# Patient Record
Sex: Female | Born: 1985 | Race: Black or African American | Hispanic: No | Marital: Single | Smoking: Never smoker
Health system: Southern US, Community
[De-identification: ages and names within clinical notes are randomized; demographics above are authoritative.]

## PROBLEM LIST (undated history)

## (undated) DIAGNOSIS — O139 Gestational [pregnancy-induced] hypertension without significant proteinuria, unspecified trimester: Secondary | ICD-10-CM

## (undated) HISTORY — DX: Gestational (pregnancy-induced) hypertension without significant proteinuria, unspecified trimester: O13.9

---

## 2013-03-25 ENCOUNTER — Other Ambulatory Visit: Payer: Self-pay | Admitting: Obstetrics & Gynecology

## 2013-03-25 ENCOUNTER — Other Ambulatory Visit (INDEPENDENT_AMBULATORY_CARE_PROVIDER_SITE_OTHER): Payer: Self-pay

## 2013-03-25 DIAGNOSIS — Z3201 Encounter for pregnancy test, result positive: Secondary | ICD-10-CM

## 2013-03-25 LAB — HIV ANTIBODY (ROUTINE TESTING W REFLEX): HIV: NONREACTIVE

## 2013-03-25 LAB — POCT PREGNANCY, URINE: Preg Test, Ur: POSITIVE — AB

## 2013-03-26 LAB — OBSTETRIC PANEL
Basophils Relative: 0 % (ref 0–1)
Eosinophils Absolute: 0 10*3/uL (ref 0.0–0.7)
Eosinophils Relative: 0 % (ref 0–5)
Hemoglobin: 10.4 g/dL — ABNORMAL LOW (ref 12.0–15.0)
Hepatitis B Surface Ag: NEGATIVE
Lymphs Abs: 2.4 10*3/uL (ref 0.7–4.0)
MCH: 29.7 pg (ref 26.0–34.0)
MCHC: 33.4 g/dL (ref 30.0–36.0)
MCV: 88.9 fL (ref 78.0–100.0)
Monocytes Relative: 6 % (ref 3–12)
Neutrophils Relative %: 63 % (ref 43–77)
Platelets: 322 10*3/uL (ref 150–400)
Rh Type: POSITIVE

## 2013-03-27 ENCOUNTER — Ambulatory Visit (HOSPITAL_COMMUNITY)
Admission: RE | Admit: 2013-03-27 | Discharge: 2013-03-27 | Disposition: A | Discharge status: Home / Self Care | Payer: Medicaid Other | Source: Ambulatory Visit | Attending: Obstetrics & Gynecology | Admitting: Obstetrics & Gynecology

## 2013-03-27 DIAGNOSIS — Z3689 Encounter for other specified antenatal screening: Secondary | ICD-10-CM | POA: Insufficient documentation

## 2013-03-27 DIAGNOSIS — Z3201 Encounter for pregnancy test, result positive: Secondary | ICD-10-CM

## 2013-03-27 DIAGNOSIS — O3680X0 Pregnancy with inconclusive fetal viability, not applicable or unspecified: Secondary | ICD-10-CM | POA: Insufficient documentation

## 2013-03-27 LAB — HEMOGLOBINOPATHY EVALUATION
Hemoglobin Other: 0 %
Hgb A2 Quant: 2.9 % (ref 2.2–3.2)
Hgb A: 97.1 % (ref 96.8–97.8)

## 2013-04-30 ENCOUNTER — Encounter: Payer: Medicaid Other | Admitting: Advanced Practice Midwife

## 2013-05-05 ENCOUNTER — Encounter: Payer: Medicaid Other | Admitting: Obstetrics & Gynecology

## 2013-06-30 ENCOUNTER — Other Ambulatory Visit (HOSPITAL_COMMUNITY)
Admission: RE | Admit: 2013-06-30 | Discharge: 2013-06-30 | Disposition: A | Discharge status: Home / Self Care | Payer: Medicaid Other | Source: Ambulatory Visit | Attending: Family Medicine | Admitting: Family Medicine

## 2013-06-30 ENCOUNTER — Encounter: Payer: Self-pay | Admitting: Family Medicine

## 2013-06-30 ENCOUNTER — Ambulatory Visit (INDEPENDENT_AMBULATORY_CARE_PROVIDER_SITE_OTHER): Payer: Medicaid Other | Admitting: Family Medicine

## 2013-06-30 VITALS — BP 133/86 | Temp 97.0°F | Ht 68.0 in | Wt 223.0 lb

## 2013-06-30 DIAGNOSIS — O10019 Pre-existing essential hypertension complicating pregnancy, unspecified trimester: Secondary | ICD-10-CM | POA: Insufficient documentation

## 2013-06-30 DIAGNOSIS — O0932 Supervision of pregnancy with insufficient antenatal care, second trimester: Secondary | ICD-10-CM | POA: Insufficient documentation

## 2013-06-30 DIAGNOSIS — I10 Essential (primary) hypertension: Secondary | ICD-10-CM

## 2013-06-30 DIAGNOSIS — O10012 Pre-existing essential hypertension complicating pregnancy, second trimester: Secondary | ICD-10-CM

## 2013-06-30 DIAGNOSIS — O0930 Supervision of pregnancy with insufficient antenatal care, unspecified trimester: Secondary | ICD-10-CM

## 2013-06-30 DIAGNOSIS — Z01419 Encounter for gynecological examination (general) (routine) without abnormal findings: Secondary | ICD-10-CM | POA: Insufficient documentation

## 2013-06-30 DIAGNOSIS — O34219 Maternal care for unspecified type scar from previous cesarean delivery: Secondary | ICD-10-CM | POA: Insufficient documentation

## 2013-06-30 DIAGNOSIS — Z113 Encounter for screening for infections with a predominantly sexual mode of transmission: Secondary | ICD-10-CM | POA: Insufficient documentation

## 2013-06-30 LAB — POCT URINALYSIS DIP (DEVICE)
Protein, ur: NEGATIVE mg/dL
Specific Gravity, Urine: 1.02 (ref 1.005–1.030)
Urobilinogen, UA: 0.2 mg/dL (ref 0.0–1.0)

## 2013-06-30 MED ORDER — PRENATAL VITAMINS 0.8 MG PO TABS: 1.0000 | ORAL_TABLET | Freq: Every day | ORAL | Status: DC

## 2013-06-30 NOTE — Progress Notes (Signed)
Pulse   Edema trace in feet.  

## 2013-06-30 NOTE — Progress Notes (Signed)
Nutrition note: 1st visit consult Pt has h/o obesity. Pt has gained 23# @ 25w, which is > expected. Pt reports eating 3 meals & 2-3 snacks/d. Pt reports no N/V but occ heartburn. NKFA. Pt is taking PNV. Pt received verbal & written education on general nutrition during pregnancy.  Discussed wt gain goals of 11-20# or 0.5#/wk. Discussed tips to slow wt gain down. Pt agrees to continue taking PNV & watch her portion sizes. Pt has WIC & plans to BF. F/u if referred Blondell Reveal, MS, RD, LDN

## 2013-06-30 NOTE — Progress Notes (Signed)
Ultrasound scheduled for 07/08/13 at 9:30 am.

## 2013-06-30 NOTE — Progress Notes (Signed)
   Subjective:    Cindy Hendrix is a [id number] [redacted]w[redacted]d being seen today for her first obstetrical visit.  Her obstetrical history is significant for pregnancy induced hypertension and late prenatal care. Not clear that her HTN ever went away post pregnancy.  She does not see an MD regularly.  Pregnancy history fully reviewed.  Patient reports no complaints.  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 Pregnancy induced hypertension    Past Surgical History  Procedure Laterality Date  . Cesarean section     Family History  Problem Relation Age of Onset  . Cancer Maternal Grandfather 37    Colon     Exam    Uterus:   Fundal height is 25  Pelvic Exam:    Perineum: Normal Perineum   Vulva: Bartholin'Hendrix, Urethra, Skene'Hendrix normal   Vagina:  normal mucosa, normal discharge   Cervix: no cervical motion tenderness and no lesions   Adnexa: normal adnexa   Bony Pelvis: average  System: Breast:  normal appearance, no masses or tenderness   Skin: normal coloration and turgor, no rashes    Neurologic: oriented   Extremities: normal strength, tone, and muscle mass   HEENT sclera clear, anicteric   Mouth/Teeth mucous membranes moist, pharynx normal without lesions   Neck supple   Cardiovascular: regular rate and rhythm   Respiratory:  appears well, vitals normal, no respiratory distress, acyanotic, normal RR, ear and throat exam is normal, neck free of mass or lymphadenopathy, chest clear, no wheezing, crepitations, rhonchi, normal symmetric air entry   Abdomen: soft,  non-tender; bowel sounds normal; no masses,  no organomegaly      Assessment:    Pregnancy: [id number] Patient Active Problem List   Diagnosis Date Noted  . Benign essential hypertension antepartum 06/30/2013  . Previous cesarean delivery, antepartum condition or complication 06/30/2013  . Late prenatal care complicating pregnancy in second trimester 06/30/2013        Plan:     Initial labs drawn. Prenatal vitamins. Problem list reviewed and updated. Genetic Screening discussed Quad Screen: too late.  Ultrasound discussed; fetal survey: ordered.  Follow up in 2 weeks. Pap smear today 2 wks return w/ 24 hour urine and for 28 wk labs.     Cindy Hendrix 06/30/2013

## 2013-06-30 NOTE — Patient Instructions (Signed)
Preeclampsia and Eclampsia Preeclampsia is a condition of high blood pressure during pregnancy. It can happen at 20 weeks or later in pregnancy. If high blood pressure occurs in the second half of pregnancy with no other symptoms, it is called gestational hypertension and goes away after the baby is born. If any of the symptoms listed below develop with gestational hypertension, it is then called preeclampsia. Eclampsia (convulsions) may follow preeclampsia. This is one of the reasons for regular prenatal checkups. Early diagnosis and treatment are very important to prevent eclampsia. CAUSES  There is no known cause of preeclampsia/eclampsia in pregnancy. There are several known conditions that may put the pregnant woman at risk, such as:  The first pregnancy.  Having preeclampsia in a past pregnancy.  Having lasting (chronic) high blood pressure.  Having multiples (twins, triplets).  Being age 24 or older.  African American ethnic background.  Having kidney disease or diabetes.  Medical conditions such as lupus or blood diseases.  Being overweight (obese). SYMPTOMS   High blood pressure.  Headaches.  Sudden weight gain.  Swelling of hands, face, legs, and feet.  Protein in the urine.  Feeling sick to your stomach (nauseous) and throwing up (vomiting).  Vision problems (blurred or double vision).  Numbness in the face, arms, legs, and feet.  Dizziness.  Slurred speech.  Preeclampsia can cause growth retardation in the fetus.  Separation (abruption) of the placenta.  Not enough fluid in the amniotic sac (oligohydramnios).  Sensitivity to bright lights.  Belly (abdominal) pain. DIAGNOSIS  If protein is found in the urine in the second half of pregnancy, this is considered preeclampsia. Other symptoms mentioned above may also be present. TREATMENT  It is necessary to treat this.  Your caregiver may prescribe bed rest early in this condition. Plenty of rest and  salt restriction may be all that is needed.  Medicines may be necessary to lower blood pressure if the condition does not respond to more conservative measures.  In more severe cases, hospitalization may be needed:  For treatment of blood pressure.  To control fluid retention.  To monitor the baby to see if the condition is causing harm to the baby.  Hospitalization is the best way to treat the first sign of preeclampsia. This is so the mother and baby can be watched closely and blood tests can be done effectively and correctly.  If the condition becomes severe, it may be necessary to induce labor or to remove the infant by surgical means (cesarean section). The best cure for preeclampsia/eclampsia is to deliver the baby. Preeclampsia and eclampsia involve risks to mother and infant. Your caregiver will discuss these risks with you. Together, you can work out the best possible approach to your problems. Make sure you keep your prenatal visits as scheduled. Not keeping appointments could result in a chronic or permanent injury, pain, disability to you, and death or injury to you or your unborn baby. If there is any problem keeping the appointment, you must call to reschedule. HOME CARE INSTRUCTIONS   Keep your prenatal appointments and tests as scheduled.  Tell your caregiver if you have any of the above risk factors.  Get plenty of rest and sleep.  Eat a balanced diet that is low in salt, and do not add salt to your food.  Avoid stressful situations.  Only take over-the-counter and prescriptions medicines for pain, discomfort, or fever as directed by your caregiver. SEEK IMMEDIATE MEDICAL CARE IF:   You develop severe swelling  anywhere in the body. This usually occurs in the legs.  You gain 5 lb/2.3 kg or more in a week.  You develop a severe headache, dizziness, problems with your vision, or confusion.  You have abdominal pain, nausea, or vomiting.  You have a seizure.  You  have trouble moving any part of your body, or you develop numbness or problems speaking.  You have bruising or abnormal bleeding from anywhere in the body.  You develop a stiff neck.  You pass out. MAKE SURE YOU:   Understand these instructions.  Will watch your condition.  Will get help right away if you are not doing well or get worse. Document Released: 12/01/2000 Document Revised: 02/26/2012 Document Reviewed: 07/17/2008 [facility] Patient Information 2014 [place], [place].  Pregnancy - Second Trimester The second trimester of pregnancy (3 to 6 months) is a period of rapid growth for you and your baby. At the end of the sixth month, your baby is about 9 inches long and weighs 1 1/2 pounds. You will begin to feel the baby move between 18 and 20 weeks of the pregnancy. This is called quickening. Weight gain is faster. A clear fluid (colostrum) may leak out of your breasts. You may feel small contractions of the womb (uterus). This is known as false labor or Braxton-Hicks contractions. This is like a practice for labor when the baby is ready to be born. Usually, the problems with morning sickness have usually passed by the end of your first trimester. Some women develop small dark blotches (called cholasma, mask of pregnancy) on their face that usually goes away after the baby is born. Exposure to the sun makes the blotches worse. Acne may also develop in some pregnant women and pregnant women who have acne, may find that it goes away. PRENATAL EXAMS  Blood work may continue to be done during prenatal exams. These tests are done to check on your health and the probable health of your baby. Blood work is used to follow your blood levels (hemoglobin). Anemia (low hemoglobin) is common during pregnancy. Iron and vitamins are given to help prevent this. You will also be checked for diabetes between 24 and 28 weeks of the pregnancy. Some of the previous blood tests may be repeated.  The size of the  uterus is measured during each visit. This is to make sure that the baby is continuing to grow properly according to the dates of the pregnancy.  Your blood pressure is checked every prenatal visit. This is to make sure you are not getting toxemia.  Your urine is checked to make sure you do not have an infection, diabetes or protein in the urine.  Your weight is checked often to make sure gains are happening at the suggested rate. This is to ensure that both you and your baby are growing normally.  Sometimes, an ultrasound is performed to confirm the proper growth and development of the baby. This is a test which bounces harmless sound waves off the baby so your caregiver can more accurately determine due dates. Sometimes, a test is done on the amniotic fluid surrounding the baby. This test is called an amniocentesis. The amniotic fluid is obtained by sticking a needle into the belly (abdomen). This is done to check the chromosomes in instances where there is a concern about possible genetic problems with the baby. It is also sometimes done near the end of pregnancy if an early delivery is required. In this case, it is done to help  make sure the baby's lungs are mature enough for the baby to live outside of the womb. CHANGES OCCURING IN THE SECOND TRIMESTER OF PREGNANCY Your body goes through many changes during pregnancy. They vary from person to person. Talk to your caregiver about changes you notice that you are concerned about.  During the second trimester, you will likely have an increase in your appetite. It is normal to have cravings for certain foods. This varies from person to person and pregnancy to pregnancy.  Your lower abdomen will begin to bulge.  You may have to urinate more often because the uterus and baby are pressing on your bladder. It is also common to get more bladder infections during pregnancy. You can help this by drinking lots of fluids and emptying your bladder before and  after intercourse.  You may begin to get stretch marks on your hips, abdomen, and breasts. These are normal changes in the body during pregnancy. There are no exercises or medicines to take that prevent this change.  You may begin to develop swollen and bulging veins (varicose veins) in your legs. Wearing support hose, elevating your feet for 15 minutes, 3 to 4 times a day and limiting salt in your diet helps lessen the problem.  Heartburn may develop as the uterus grows and pushes up against the stomach. Antacids recommended by your caregiver helps with this problem. Also, eating smaller meals 4 to 5 times a day helps.  Constipation can be treated with a stool softener or adding bulk to your diet. Drinking lots of fluids, and eating vegetables, fruits, and whole grains are helpful.  Exercising is also helpful. If you have been very active up until your pregnancy, most of these activities can be continued during your pregnancy. If you have been less active, it is helpful to start an exercise program such as walking.  Hemorrhoids may develop at the end of the second trimester. Warm sitz baths and hemorrhoid cream recommended by your caregiver helps hemorrhoid problems.  Backaches may develop during this time of your pregnancy. Avoid heavy lifting, wear low heal shoes, and practice good posture to help with backache problems.  Some pregnant women develop tingling and numbness of their hand and fingers because of swelling and tightening of ligaments in the wrist (carpel tunnel syndrome). This goes away after the baby is born.  As your breasts enlarge, you may have to get a bigger bra. Get a comfortable, cotton, support bra. Do not get a nursing bra until the last month of the pregnancy if you will be nursing the baby.  You may get a dark line from your belly button to the pubic area called the linea nigra.  You may develop rosy cheeks because of increase blood flow to the face.  You may develop  spider looking lines of the face, neck, arms, and chest. These go away after the baby is born. HOME CARE INSTRUCTIONS   It is extremely important to avoid all smoking, herbs, alcohol, and unprescribed drugs during your pregnancy. These chemicals affect the formation and growth of the baby. Avoid these chemicals throughout the pregnancy to ensure the delivery of a healthy infant.  Most of your home care instructions are the same as suggested for the first trimester of your pregnancy. Keep your caregiver's appointments. Follow your caregiver's instructions regarding medicine use, exercise, and diet.  During pregnancy, you are providing food for you and your baby. Continue to eat regular, well-balanced meals. Choose foods such as meat, fish,  milk and other low fat dairy products, vegetables, fruits, and whole-grain breads and cereals. Your caregiver will tell you of the ideal weight gain.  A physical sexual relationship may be continued up until near the end of pregnancy if there are no other problems. Problems could include early (premature) leaking of amniotic fluid from the membranes, vaginal bleeding, abdominal pain, or other medical or pregnancy problems.  Exercise regularly if there are no restrictions. Check with your caregiver if you are unsure of the safety of some of your exercises. The greatest weight gain will occur in the last 2 trimesters of pregnancy. Exercise will help you:  Control your weight.  Get you in shape for labor and delivery.  Lose weight after you have the baby.  Wear a good support or jogging bra for breast tenderness during pregnancy. This may help if worn during sleep. Pads or tissues may be used in the bra if you are leaking colostrum.  Do not use hot tubs, steam rooms or saunas throughout the pregnancy.  Wear your seat belt at all times when driving. This protects you and your baby if you are in an accident.  Avoid raw meat, uncooked cheese, cat litter boxes,  and soil used by cats. These carry germs that can cause birth defects in the baby.  The second trimester is also a good time to visit your dentist for your dental health if this has not been done yet. Getting your teeth cleaned is okay. Use a soft toothbrush. Brush gently during pregnancy.  It is easier to leak urine during pregnancy. Tightening up and strengthening the pelvic muscles will help with this problem. Practice stopping your urination while you are going to the bathroom. These are the same muscles you need to strengthen. It is also the muscles you would use as if you were trying to stop from passing gas. You can practice tightening these muscles up 10 times a set and repeating this about 3 times per day. Once you know what muscles to tighten up, do not perform these exercises during urination. It is more likely to contribute to an infection by backing up the urine.  Ask for help if you have financial, counseling, or nutritional needs during pregnancy. Your caregiver will be able to offer counseling for these needs as well as refer you for other special needs.  Your skin may become oily. If so, wash your face with mild soap, use non-greasy moisturizer and oil or cream based makeup. MEDICINES AND DRUG USE IN PREGNANCY  Take prenatal vitamins as directed. The vitamin should contain 1 milligram of folic acid. Keep all vitamins out of reach of children. Only a couple vitamins or tablets containing iron may be fatal to a baby or young child when ingested.  Avoid use of all medicines, including herbs, over-the-counter medicines, not prescribed or suggested by your caregiver. Only take over-the-counter or prescription medicines for pain, discomfort, or fever as directed by your caregiver. Do not use aspirin.  Let your caregiver also know about herbs you may be using.  Alcohol is related to a number of birth defects. This includes fetal alcohol syndrome. All alcohol, in any form, should be avoided  completely. Smoking will cause low birth rate and premature babies.  Street or illegal drugs are very harmful to the baby. They are absolutely forbidden. A baby born to an addicted mother will be addicted at birth. The baby will go through the same withdrawal an adult does. SEEK MEDICAL CARE IF:  You have any concerns or worries during your pregnancy. It is better to call with your questions if you feel they cannot wait, rather than worry about them. SEEK IMMEDIATE MEDICAL CARE IF:   An unexplained oral temperature above 102 F (38.9 C) develops, or as your caregiver suggests.  You have leaking of fluid from the vagina (birth canal). If leaking membranes are suspected, take your temperature and tell your caregiver of this when you call.  There is vaginal spotting, bleeding, or passing clots. Tell your caregiver of the amount and how many pads are used. Light spotting in pregnancy is common, especially following intercourse.  You develop a bad smelling vaginal discharge with a change in the color from clear to white.  You continue to feel sick to your stomach (nauseated) and have no relief from remedies suggested. You vomit blood or coffee ground-like materials.  You lose more than 2 pounds of weight or gain more than 2 pounds of weight over 1 week, or as suggested by your caregiver.  You notice swelling of your face, hands, feet, or legs.  You get exposed to [place] measles and have never had them.  You are exposed to fifth disease or chickenpox.  You develop belly (abdominal) pain. Round ligament discomfort is a common non-cancerous (benign) cause of abdominal pain in pregnancy. Your caregiver still must evaluate you.  You develop a bad headache that does not go away.  You develop fever, diarrhea, pain with urination, or shortness of breath.  You develop visual problems, blurry, or double vision.  You fall or are in a car accident or any kind of trauma.  There is mental or  physical violence at home. Document Released: 11/28/2001 Document Revised: 08/28/2012 Document Reviewed: 06/02/2009 [facility] Patient Information 2014 [place], [place].  Breastfeeding A change in hormones during your pregnancy causes growth of your breast tissue and an increase in number and size of milk ducts. The hormone prolactin allows proteins, sugars, and fats from your blood supply to make breast milk in your milk-producing glands. The hormone progesterone prevents breast milk from being released before the birth of your baby. After the birth of your baby, your progesterone level decreases allowing breast milk to be released. Thoughts of your baby, as well as his or her sucking or crying, can stimulate the release of milk from the milk-producing glands. Deciding to breastfeed (nurse) is one of the best choices you can make for you and your baby. The information that follows gives a brief review of the benefits, as well as other important skills to know about breastfeeding. BENEFITS OF BREASTFEEDING For your baby  The first milk (colostrum) helps your baby's digestive system function better.   There are antibodies in your milk that help your baby fight off infections.   Your baby has a lower incidence of asthma, allergies, and sudden infant death syndrome (SIDS).   The nutrients in breast milk are better for your baby than infant formulas.  Breast milk improves your baby's brain development.   Your baby will have less gas, colic, and constipation.  Your baby is less likely to develop other conditions, such as childhood obesity, asthma, or diabetes mellitus. For you  Breastfeeding helps develop a very special bond between you and your baby.   Breastfeeding is convenient, always available at the correct temperature, and costs nothing.   Breastfeeding helps to burn calories and helps you lose the weight gained during pregnancy.   Breastfeeding makes your uterus contract back  down  to normal size faster and slows bleeding following delivery.   Breastfeeding mothers have a lower risk of developing osteoporosis or breast or ovarian cancer later in life.  BREASTFEEDING FREQUENCY  A healthy, full-term baby may breastfeed as often as every hour or space his or her feedings to every 3 hours. Breastfeeding frequency will vary from baby to baby.   Newborns should be fed no less than every 2 3 hours during the day and every 4 5 hours during the night. You should breastfeed a minimum of 8 feedings in a 24 hour period.  Awaken your baby to breastfeed if it has been 3 4 hours since the last feeding.  Breastfeed when you feel the need to reduce the fullness of your breasts or when your newborn shows signs of hunger. Signs that your baby may be hungry include:  Increased alertness or activity.  Stretching.  Movement of the head from side to side.  Movement of the head and opening of the mouth when the corner of the mouth or cheek is stroked (rooting).  Increased sucking sounds, smacking lips, cooing, sighing, or squeaking.  Hand-to-mouth movements.  Increased sucking of fingers or hands.  Fussing.  Intermittent crying.  Signs of extreme hunger will require calming and consoling before you try to feed your baby. Signs of extreme hunger may include:  Restlessness.  A loud, strong cry.  Screaming.  Frequent feeding will help you make more milk and will help prevent problems, such as sore nipples and engorgement of the breasts.  BREASTFEEDING   Whether lying down or sitting, be sure that the baby's abdomen is facing your abdomen.   Support your breast with 4 fingers under your breast and your thumb above your nipple. Make sure your fingers are well away from your nipple and your baby's mouth.   Stroke your baby's lips gently with your finger or nipple.   When your baby's mouth is open wide enough, place all of your nipple and as much of the colored  area around your nipple (areola) as possible into your baby's mouth.  More areola should be visible above his or her upper lip than below his or her lower lip.  Your baby's tongue should be between his or her lower gum and your breast.  Ensure that your baby's mouth is correctly positioned around the nipple (latched). Your baby's lips should create a seal on your breast.  Signs that your baby has effectively latched onto your nipple include:  Tugging or sucking without pain.  Swallowing heard between sucks.  Absent click or smacking sound.  Muscle movement above and in front of his or her ears with sucking.  Your baby must suck about 2 3 minutes in order to get your milk. Allow your baby to feed on each breast as long as he or she wants. Nurse your baby until he or she unlatches or falls asleep at the first breast, then offer the second breast.  Signs that your baby is full and satisfied include:  A gradual decrease in the number of sucks or complete cessation of sucking.  Falling asleep.  Extension or relaxation of his or her body.  Retention of a small amount of milk in his or her mouth.  Letting go of your breast by himself or herself.  Signs of effective breastfeeding in you include:  Breasts that have increased firmness, weight, and size prior to feeding.  Breasts that are softer after nursing.  Increased milk volume, as well as  a change in milk consistency and color by the 5th day of breastfeeding.  Breast fullness relieved by breastfeeding.  Nipples are not sore, cracked, or bleeding.  If needed, break the suction by putting your finger into the corner of your baby's mouth and sliding your finger between his or her gums. Then, remove your breast from his or her mouth.  It is common for babies to spit up a small amount after a feeding.  Babies often swallow air during feeding. This can make babies fussy. Burping your baby between breasts can help with  this.  Vitamin D supplements are recommended for babies who get only breast milk.  Avoid using a pacifier during your baby's first 4 6 weeks.  Avoid supplemental feedings of water, formula, or juice in place of breastfeeding. Breast milk is all the food your baby needs. It is not necessary for your baby to have water or formula. Your breasts will make more milk if supplemental feedings are avoided during the early weeks. HOW TO TELL WHETHER YOUR BABY IS GETTING ENOUGH BREAST MILK Wondering whether or not your baby is getting enough milk is a common concern among mothers. You can be assured that your baby is getting enough milk if:   Your baby is actively sucking and you hear swallowing.   Your baby seems relaxed and satisfied after a feeding.   Your baby nurses at least 8 12 times in a 24 hour time period.  During the first 51 66 days of age:  Your baby is wetting at least 3 5 diapers in a 24 hour period. The urine should be clear and pale yellow.  Your baby is having at least 3 4 stools in a 24 hour period. The stool should be soft and yellow.  At 63 36 days of age, your baby is having at least 3 6 stools in a 24 hour period. The stool should be seedy and yellow by 5 days of age.  Your baby has a weight loss less than 7 10% during the first 41 days of age.  Your baby does not lose weight after 35 1 days of age.  Your baby gains 4 7 ounces each week after he or she is 5 days of age.  Your baby gains weight by 14 days of age and is back to birth weight within 2 weeks. ENGORGEMENT In the first week after your baby is born, you may experience extremely full breasts (engorgement). When engorged, your breasts may feel heavy, warm, or tender to the touch. Engorgement peaks within 24 48 hours after delivery of your baby.  Engorgement may be reduced by:  Continuing to breastfeed.  Increasing the frequency of breastfeeding.  Taking warm showers or applying warm, moist heat to your breasts  just before each feeding. This increases circulation and helps the milk flow.   Gently massaging your breast before and during the feedings. With your fingertips, massage from your chest wall towards your nipple in a circular motion.   Ensuring that your baby empties at least one breast at every feeding. It also helps to start the next feeding on the opposite breast.   Expressing breast milk by hand or by using a breast pump to empty the breasts if your baby is sleepy, or not nursing well. You may also want to express milk if you are returning to work oryou feel you are getting engorged.  Ensuring your baby is latched on and positioned properly while breastfeeding. If you follow these  suggestions, your engorgement should improve in 24 48 hours. If you are still experiencing difficulty, call your lactation consultant or caregiver.  CARING FOR YOURSELF Take care of your breasts.  Bathe or shower daily.   Avoid using soap on your nipples.   Wear a supportive bra. Avoid wearing underwire style bras.  Air dry your nipples for a 3 after each feeding.   Use only cotton bra pads to absorb breast milk leakage. Leaking of breast milk between feedings is normal.   Use only pure lanolin on your nipples after nursing. You do not need to wash it off before feeding your baby again. Another option is to express a few drops of breast milk and gently massage that milk into your nipples.  Continue breast self-awareness checks. Take care of yourself.  Eat healthy foods. Alternate 3 meals with 3 snacks.  Avoid foods that you notice affect your baby in a bad way.  Drink milk, fruit juice, and water to satisfy your thirst (about 8 glasses a day).   Rest often, relax, and take your prenatal vitamins to prevent fatigue, stress, and anemia.  Avoid chewing and smoking tobacco.  Avoid alcohol and drug use.  Take over-the-counter and prescribed medicine only as directed by your  caregiver or pharmacist. You should always check with your caregiver or pharmacist before taking any new medicine, vitamin, or herbal supplement.  Know that pregnancy is possible while breastfeeding. If desired, talk to your caregiver about family planning and safe birth control methods that may be used while breastfeeding. SEEK MEDICAL CARE IF:   You feel like you want to stop breastfeeding or have become frustrated with breastfeeding.  You have painful breasts or nipples.  Your nipples are cracked or bleeding.  Your breasts are red, tender, or warm.  You have a swollen area on either breast.  You have a fever or chills.  You have nausea or vomiting.  You have drainage from your nipples.  Your breasts do not become full before feedings by the 5th day after delivery.  You feel sad and depressed.  Your baby is too sleepy to eat well.  Your baby is having trouble sleeping.   Your baby is wetting less than 3 diapers in a 24 hour period.  Your baby has less than 3 stools in a 24 hour period.  Your baby's skin or the white part of his or her eyes becomes more yellow.   Your baby is not gaining weight by 49 days of age. MAKE SURE YOU:   Understand these instructions.  Will watch your condition.  Will get help right away if you are not doing well or get worse. Document Released: 12/04/2005 Document Revised: 08/28/2012 Document Reviewed: 07/10/2012 [facility] Patient Information 2014 [place], [place].

## 2013-07-02 LAB — CULTURE, OB URINE

## 2013-07-03 ENCOUNTER — Telehealth: Payer: Self-pay | Admitting: Obstetrics and Gynecology

## 2013-07-03 DIAGNOSIS — N390 Urinary tract infection, site not specified: Secondary | ICD-10-CM

## 2013-07-03 MED ORDER — CEPHALEXIN 500 MG PO CAPS: 500.0000 mg | ORAL_CAPSULE | Freq: Four times a day (QID) | ORAL | Status: DC

## 2013-07-03 NOTE — Telephone Encounter (Signed)
   Needs treatment for UTI-Keflex 500mg  qid x 7 d, # 28, no RF    Called patient to give message from Dr. Shawnie Pons for UTI and Rx sent to pharmacy. pls let patient know.

## 2013-07-08 ENCOUNTER — Ambulatory Visit (HOSPITAL_COMMUNITY)
Admission: RE | Admit: 2013-07-08 | Discharge: 2013-07-08 | Disposition: A | Discharge status: Home / Self Care | Payer: Medicaid Other | Source: Ambulatory Visit | Attending: Family Medicine | Admitting: Family Medicine

## 2013-07-08 ENCOUNTER — Other Ambulatory Visit: Payer: Medicaid Other

## 2013-07-08 DIAGNOSIS — O10012 Pre-existing essential hypertension complicating pregnancy, second trimester: Secondary | ICD-10-CM

## 2013-07-08 DIAGNOSIS — O0932 Supervision of pregnancy with insufficient antenatal care, second trimester: Secondary | ICD-10-CM

## 2013-07-08 DIAGNOSIS — O0930 Supervision of pregnancy with insufficient antenatal care, unspecified trimester: Secondary | ICD-10-CM | POA: Insufficient documentation

## 2013-07-08 DIAGNOSIS — I10 Essential (primary) hypertension: Secondary | ICD-10-CM

## 2013-07-08 DIAGNOSIS — O34219 Maternal care for unspecified type scar from previous cesarean delivery: Secondary | ICD-10-CM

## 2013-07-08 DIAGNOSIS — O10019 Pre-existing essential hypertension complicating pregnancy, unspecified trimester: Secondary | ICD-10-CM | POA: Insufficient documentation

## 2013-07-08 LAB — COMPREHENSIVE METABOLIC PANEL
ALT: 17 U/L (ref 0–35)
AST: 21 U/L (ref 0–37)
Albumin: 3.9 g/dL (ref 3.5–5.2)
Alkaline Phosphatase: 54 U/L (ref 39–117)
Glucose, Bld: 83 mg/dL (ref 70–99)
Potassium: 4.2 mEq/L (ref 3.5–5.3)
Sodium: 138 mEq/L (ref 135–145)
Total Protein: 6.7 g/dL (ref 6.0–8.3)

## 2013-07-08 NOTE — Telephone Encounter (Signed)
Called and left message that there is an Rx for antibiotics at her [facility] pharmacy for UTI and if she has any questions to please give [place] a call back.

## 2013-07-09 LAB — PROTEIN, URINE, 24 HOUR: Protein, 24H Urine: 62 mg/d (ref 50–100)

## 2013-07-09 LAB — CREATININE CLEARANCE, URINE, 24 HOUR: Creatinine, Urine: 127.9 mg/dL

## 2013-07-10 ENCOUNTER — Encounter: Payer: Self-pay | Admitting: Family Medicine

## 2013-07-14 ENCOUNTER — Encounter: Payer: Medicaid Other | Admitting: Obstetrics and Gynecology

## 2013-07-28 ENCOUNTER — Ambulatory Visit (INDEPENDENT_AMBULATORY_CARE_PROVIDER_SITE_OTHER): Payer: Medicaid Other | Admitting: Obstetrics & Gynecology

## 2013-07-28 VITALS — BP 124/81 | Temp 97.2°F | Wt 226.8 lb

## 2013-07-28 DIAGNOSIS — O10019 Pre-existing essential hypertension complicating pregnancy, unspecified trimester: Secondary | ICD-10-CM

## 2013-07-28 LAB — CBC
HCT: 30.7 % — ABNORMAL LOW (ref 36.0–46.0)
Hemoglobin: 10.6 g/dL — ABNORMAL LOW (ref 12.0–15.0)
MCH: 30 pg (ref 26.0–34.0)
MCHC: 34.5 g/dL (ref 30.0–36.0)

## 2013-07-28 LAB — POCT URINALYSIS DIP (DEVICE)
Protein, ur: NEGATIVE mg/dL
Specific Gravity, Urine: 1.02 (ref 1.005–1.030)
Urobilinogen, UA: 0.2 mg/dL (ref 0.0–1.0)
pH: 7 (ref 5.0–8.0)

## 2013-07-28 NOTE — Progress Notes (Signed)
Pulse- 86 Have not taken antibiotics

## 2013-07-28 NOTE — Progress Notes (Signed)
No problems today.  BP well controlled.  Continue current care.

## 2013-07-29 ENCOUNTER — Encounter: Payer: Self-pay | Admitting: *Deleted

## 2013-07-29 ENCOUNTER — Encounter: Payer: Self-pay | Admitting: Obstetrics & Gynecology

## 2013-08-11 ENCOUNTER — Encounter: Payer: Medicaid Other | Admitting: Obstetrics & Gynecology

## 2013-08-20 ENCOUNTER — Encounter: Payer: Self-pay | Admitting: *Deleted

## 2013-10-08 ENCOUNTER — Encounter (HOSPITAL_COMMUNITY): Payer: Self-pay | Admitting: *Deleted

## 2013-10-08 ENCOUNTER — Ambulatory Visit (INDEPENDENT_AMBULATORY_CARE_PROVIDER_SITE_OTHER): Payer: Medicaid Other | Admitting: Obstetrics and Gynecology

## 2013-10-08 ENCOUNTER — Inpatient Hospital Stay (HOSPITAL_COMMUNITY)
Admission: AD | Admit: 2013-10-08 | Discharge: 2013-10-12 | DRG: 765 | Disposition: A | Discharge status: Home / Self Care | Payer: Medicaid Other | Source: Ambulatory Visit | Attending: Obstetrics & Gynecology | Admitting: Obstetrics & Gynecology

## 2013-10-08 VITALS — BP 158/107 | Temp 97.6°F | Wt 240.0 lb

## 2013-10-08 DIAGNOSIS — O34219 Maternal care for unspecified type scar from previous cesarean delivery: Secondary | ICD-10-CM

## 2013-10-08 DIAGNOSIS — I10 Essential (primary) hypertension: Secondary | ICD-10-CM | POA: Diagnosis present

## 2013-10-08 DIAGNOSIS — IMO0002 Reserved for concepts with insufficient information to code with codable children: Secondary | ICD-10-CM | POA: Diagnosis present

## 2013-10-08 DIAGNOSIS — O163 Unspecified maternal hypertension, third trimester: Secondary | ICD-10-CM

## 2013-10-08 DIAGNOSIS — O10019 Pre-existing essential hypertension complicating pregnancy, unspecified trimester: Secondary | ICD-10-CM

## 2013-10-08 DIAGNOSIS — O0932 Supervision of pregnancy with insufficient antenatal care, second trimester: Secondary | ICD-10-CM

## 2013-10-08 DIAGNOSIS — O0930 Supervision of pregnancy with insufficient antenatal care, unspecified trimester: Secondary | ICD-10-CM

## 2013-10-08 DIAGNOSIS — O10013 Pre-existing essential hypertension complicating pregnancy, third trimester: Secondary | ICD-10-CM

## 2013-10-08 LAB — CBC
HCT: 32.1 % — ABNORMAL LOW (ref 36.0–46.0)
MCH: 29.8 pg (ref 26.0–34.0)
MCV: 85.4 fL (ref 78.0–100.0)
Platelets: 265 10*3/uL (ref 150–400)
RBC: 3.76 MIL/uL — ABNORMAL LOW (ref 3.87–5.11)
RDW: 13.5 % (ref 11.5–15.5)

## 2013-10-08 LAB — RAPID URINE DRUG SCREEN, HOSP PERFORMED
Cocaine: NOT DETECTED
Opiates: NOT DETECTED

## 2013-10-08 LAB — COMPREHENSIVE METABOLIC PANEL
AST: 51 U/L — ABNORMAL HIGH (ref 0–37)
BUN: 7 mg/dL (ref 6–23)
CO2: 23 mEq/L (ref 19–32)
Calcium: 9.4 mg/dL (ref 8.4–10.5)
Creatinine, Ser: 0.73 mg/dL (ref 0.50–1.10)
GFR calc non Af Amer: >90 mL/min (ref >90)
Total Bilirubin: 0.6 mg/dL (ref 0.3–1.2)

## 2013-10-08 LAB — POCT URINALYSIS DIP (DEVICE)
Ketones, ur: NEGATIVE mg/dL
Protein, ur: 30 mg/dL — AB
Specific Gravity, Urine: 1.02 (ref 1.005–1.030)
Urobilinogen, UA: 0.2 mg/dL (ref 0.0–1.0)

## 2013-10-08 LAB — ABO/RH: ABO/RH(D): O POS

## 2013-10-08 LAB — RPR: RPR Ser Ql: NONREACTIVE

## 2013-10-08 LAB — OB RESULTS CONSOLE GC/CHLAMYDIA: Chlamydia: NEGATIVE

## 2013-10-08 LAB — PROTEIN / CREATININE RATIO, URINE
Creatinine, Urine: 153.63 mg/dL
Protein Creatinine Ratio: 0.35 — ABNORMAL HIGH (ref 0.00–0.15)
Total Protein, Urine: 54.3 mg/dL

## 2013-10-08 MED ORDER — LACTATED RINGERS IV SOLN: 500.0000 mL | INTRAVENOUS | Status: DC | PRN

## 2013-10-08 MED ORDER — MAGNESIUM SULFATE 40 G IN LACTATED RINGERS - SIMPLE
2.0000 g/h | INTRAVENOUS | Status: AC
  Administered 2013-10-09 – 2013-10-11 (×3): 2 g/h via INTRAVENOUS
  Filled 2013-10-08 (×4): qty 500

## 2013-10-08 MED ORDER — HYDRALAZINE HCL 20 MG/ML IJ SOLN
10.0000 mg | INTRAMUSCULAR | Status: DC | PRN
  Administered 2013-10-08 – 2013-10-09 (×3): 10 mg via INTRAVENOUS
  Filled 2013-10-08 (×3): qty 1

## 2013-10-08 MED ORDER — FLEET ENEMA 7-19 GM/118ML RE ENEM: 1.0000 | ENEMA | RECTAL | Status: DC | PRN

## 2013-10-08 MED ORDER — LIDOCAINE HCL (PF) 1 % IJ SOLN: 30.0000 mL | INTRAMUSCULAR | Status: DC | PRN

## 2013-10-08 MED ORDER — OXYTOCIN BOLUS FROM INFUSION: 500.0000 mL | INTRAVENOUS | Status: DC

## 2013-10-08 MED ORDER — ONDANSETRON HCL 4 MG/2ML IJ SOLN: 4.0000 mg | Freq: Four times a day (QID) | INTRAMUSCULAR | Status: DC | PRN

## 2013-10-08 MED ORDER — MAGNESIUM SULFATE BOLUS VIA INFUSION
4.0000 g | Freq: Once | INTRAVENOUS | Status: AC
  Administered 2013-10-08: 4 g via INTRAVENOUS
  Filled 2013-10-08: qty 500

## 2013-10-08 MED ORDER — IBUPROFEN 600 MG PO TABS: 600.0000 mg | ORAL_TABLET | Freq: Four times a day (QID) | ORAL | Status: DC | PRN

## 2013-10-08 MED ORDER — ACETAMINOPHEN 325 MG PO TABS
650.0000 mg | ORAL_TABLET | ORAL | Status: DC | PRN
  Administered 2013-10-08 – 2013-10-09 (×2): 650 mg via ORAL
  Filled 2013-10-08 (×2): qty 2

## 2013-10-08 MED ORDER — CITRIC ACID-SODIUM CITRATE 334-500 MG/5ML PO SOLN
30.0000 mL | ORAL | Status: DC | PRN
  Administered 2013-10-10: 30 mL via ORAL
  Filled 2013-10-08: qty 15

## 2013-10-08 MED ORDER — OXYCODONE-ACETAMINOPHEN 5-325 MG PO TABS: 1.0000 | ORAL_TABLET | ORAL | Status: DC | PRN

## 2013-10-08 MED ORDER — LACTATED RINGERS IV SOLN
INTRAVENOUS | Status: DC
  Administered 2013-10-08 – 2013-10-09 (×2): via INTRAVENOUS

## 2013-10-08 MED ORDER — TERBUTALINE SULFATE 1 MG/ML IJ SOLN: 0.2500 mg | Freq: Once | INTRAMUSCULAR | Status: AC | PRN

## 2013-10-08 MED ORDER — OXYTOCIN 40 UNITS IN LACTATED RINGERS INFUSION - SIMPLE MED
62.5000 mL/h | INTRAVENOUS | Status: DC
  Filled 2013-10-08: qty 1000

## 2013-10-08 MED ORDER — FENTANYL CITRATE 0.05 MG/ML IJ SOLN
100.0000 ug | INTRAMUSCULAR | Status: DC | PRN
  Administered 2013-10-08: 100 ug via INTRAVENOUS
  Filled 2013-10-08: qty 2

## 2013-10-08 NOTE — Progress Notes (Signed)
Cindy Hendrix is a 27 y.o. G3P1011 at [redacted]w[redacted]d   Subjective: Pt tearful over personal stressors- did not offer details; denies physical pain at present; foley bulb placed at 1500  Objective: BP 160/90  Pulse 70  Temp(Src) 98.1 F (36.7 C) (Oral)  Resp 20  Ht 5\' 9"  (1.753 m)  Wt 109.226 kg (240 lb 12.8 oz)  BMI 35.54 kg/m2 I/O last 3 completed shifts: In: 1051.3 [P.O.:240; I.V.:811.3] Out: 675 [Urine:575; Emesis/NG output:100]    FHT:  FHR: 125-135 bpm, variability: moderate,  accelerations:  Present,  decelerations:  Absent UC:   irregular SVE:   Dilation: 1.5 Effacement (%): 20 Station: -3 Exam by:: dr Reola Calkins- exam deferred  Labs: Lab Results  Component Value Date   WBC 9.7 10/08/2013   HGB 11.2* 10/08/2013   HCT 32.1* 10/08/2013   MCV 85.4 10/08/2013   PLT 265 10/08/2013    Assessment / Plan: IOL process CHTN with superimposed pre-e TOLAC  Continue to keep foley in place; plan Pitocin when it comes out  Cam Hai 10/08/2013, 8:11 PM

## 2013-10-08 NOTE — Progress Notes (Signed)
Pulse 71 2nd BP: 137/93   Edema trace in feet.  Declined Flu Vaccine.
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
## 2013-10-08 NOTE — Progress Notes (Signed)
Cindy Hendrix is a 27 y.o. G3P1011 at [redacted]w[redacted]d admitted for IOL for pre-E superimposed on chronic HTN  Subjective: Pt doing well. Comfortable after eating lunch. +FM. No ctx. No HA, vision changes, sob, lee.   Objective: BP 140/101  Pulse 89  Resp 20  Ht 5\' 9"  (1.753 m)  Wt 109.226 kg (240 lb 12.8 oz)  BMI 35.54 kg/m2   Total I/O In: 551.3 [P.O.:240; I.V.:311.3] Out: 200 [Urine:200]  FHT:  FHR: 130 bpm, variability: moderate,  accelerations:  Present,  decelerations:  Absent UC:   irregular SVE:   Dilation: 1.5 Effacement (%): 20 Station: -3 Exam by:: dr Reola Calkins  Labs: Lab Results  Component Value Date   WBC 9.7 10/08/2013   HGB 11.2* 10/08/2013   HCT 32.1* 10/08/2013   MCV 85.4 10/08/2013   PLT 265 10/08/2013    Assessment / Plan: IOL for cHTN with superimposed pre-E and desiring VBAC  Labor: foley bulb placed and inflated with 60cc of LR Preeclampsia:  on magnesium sulfate, no signs or symptoms of toxicity, intake and ouput balanced, labs stable and reflexes 1+ Fetal Wellbeing:  Category I Pain Control:  Labor support without medications planning epidural I/D:  GBS unknown. GBS culture sent Anticipated MOD:  NSVD  Zackarie Chason L 10/08/2013, 3:25 PM
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
## 2013-10-08 NOTE — Patient Instructions (Signed)

## 2013-10-08 NOTE — MAU Note (Signed)
Patient sent from the clinic to evaluate elevated blood pressure.

## 2013-10-09 ENCOUNTER — Encounter: Payer: Self-pay | Admitting: Advanced Practice Midwife

## 2013-10-09 LAB — OB RESULTS CONSOLE GBS: GBS: NEGATIVE

## 2013-10-09 LAB — GC/CHLAMYDIA PROBE AMP
CT Probe RNA: NEGATIVE
GC Probe RNA: NEGATIVE

## 2013-10-09 MED ORDER — TERBUTALINE SULFATE 1 MG/ML IJ SOLN: 0.2500 mg | Freq: Once | INTRAMUSCULAR | Status: AC | PRN

## 2013-10-09 MED ORDER — LABETALOL HCL 200 MG PO TABS
200.0000 mg | ORAL_TABLET | Freq: Two times a day (BID) | ORAL | Status: DC
  Administered 2013-10-09 – 2013-10-10 (×3): 200 mg via ORAL
  Filled 2013-10-09 (×4): qty 1

## 2013-10-09 MED ORDER — LABETALOL HCL 5 MG/ML IV SOLN
20.0000 mg | Freq: Once | INTRAVENOUS | Status: AC
  Administered 2013-10-09: 20 mg via INTRAVENOUS
  Filled 2013-10-09: qty 4

## 2013-10-09 MED ORDER — OXYTOCIN 40 UNITS IN LACTATED RINGERS INFUSION - SIMPLE MED
1.0000 m[IU]/min | INTRAVENOUS | Status: DC
  Administered 2013-10-09: 2 m[IU]/min via INTRAVENOUS

## 2013-10-09 NOTE — Progress Notes (Signed)
Dr leggett called for update on pt status, informed of foley bulb out, pitocin mmu, pt no feeling uc's, maternal BP's, orders received.

## 2013-10-09 NOTE — Progress Notes (Signed)
Cindy Hendrix is a 27 y.o. G3P1011 at [redacted]w[redacted]d admitted for induction of labor due to Pre-eclamptic toxemia of pregnancy superimposed on cHTN..  Subjective:  pt comfortable. Not really feeling any contractions. +FM  Objective: BP 155/97  Pulse 81  Temp(Src) 98 F (36.7 C) (Oral)  Resp 20  Ht 5\' 9"  (1.753 m)  Wt 109.226 kg (240 lb 12.8 oz)  BMI 35.54 kg/m2 I/O last 3 completed shifts: In: 2611.3 [P.O.:300; I.V.:2311.3] Out: 2175 [Urine:2075; Emesis/NG output:100] Total I/O In: 557.1 [P.O.:180; I.V.:377.1] Out: 500 [Urine:500]  FHT:  FHR: 130 bpm, variability: moderate,  accelerations:  Present,  decelerations:  Absent UC:   occasional SVE:   Dilation: 3.5 Effacement (%): 50 Station: Ballotable Exam by:: dr Reola Calkins  Labs: Lab Results  Component Value Date   WBC 9.7 10/08/2013   HGB 11.2* 10/08/2013   HCT 32.1* 10/08/2013   MCV 85.4 10/08/2013   PLT 265 10/08/2013    Assessment / Plan: IOL for superimposed pre-E on cHTN  Labor: s/p foley bulb. cervix now 3.5/50/-3. replaced foley bulb and started with low dose pitocin Preeclampsia:  on magnesium sulfate, no signs or symptoms of toxicity, intake and ouput balanced and labs stable Fetal Wellbeing:  Category I Pain Control:  Labor support without medications I/D:  gbs pending.  Term, not being treated with PCN Anticipated MOD:  NSVD  Jeryn Cerney L 10/09/2013, 10:09 AM

## 2013-10-09 NOTE — Progress Notes (Signed)
   Cindy Hendrix is a 27 y.o. G3P1011 at [redacted]w[redacted]d  admitted for induction of labor due to Pre-eclamptic toxemia of pregnancy..  Subjective: Not feeling contractions.  Hungry.  Still on board with TOLAC  Objective: BP 151/90  Pulse 75  Temp(Src) 98.3 F (36.8 C) (Axillary)  Resp 20  Ht 5\' 9"  (1.753 m)  Wt 109.226 kg (240 lb 12.8 oz)  BMI 35.54 kg/m2  SpO2 100% Total I/O In: 217.1 [P.O.:120; I.V.:97.1] Out: 250 [Urine:250]  FHT:  FHR: 130 bpm, variability: moderate,  accelerations:  Present,  decelerations:  Absent UC:   regular, every 2-3 minutes, not felt by pt SVE:   Deferred Pitocin @ 20 mu/min  Labs: Lab Results  Component Value Date   WBC 9.7 10/08/2013   HGB 11.2* 10/08/2013   HCT 32.1* 10/08/2013   MCV 85.4 10/08/2013   PLT 265 10/08/2013    Assessment / Plan: IOL for preeclampsia, not responding to pitocin Will begin Labetalol 200mg  BID PO Allow pt to shower, rest pitocin, eat.   Will restart Pitocin and plan AROM.  Dr. Penne Lash aware of plan Labor: no Fetal Wellbeing:  Category I Pain Control:  Labor support without medications Anticipated MOD:  NSVD  CRESENZO-DISHMAN,Yoshie Kosel 10/09/2013, 8:54 PM

## 2013-10-10 ENCOUNTER — Encounter (HOSPITAL_COMMUNITY)
Admission: AD | Disposition: A | Discharge status: Home / Self Care | Payer: Self-pay | Source: Ambulatory Visit | Attending: Obstetrics & Gynecology

## 2013-10-10 ENCOUNTER — Encounter (HOSPITAL_COMMUNITY): Payer: Medicaid Other | Admitting: Anesthesiology

## 2013-10-10 ENCOUNTER — Encounter (HOSPITAL_COMMUNITY): Payer: Self-pay | Admitting: *Deleted

## 2013-10-10 ENCOUNTER — Inpatient Hospital Stay (HOSPITAL_COMMUNITY): Payer: Medicaid Other | Admitting: Anesthesiology

## 2013-10-10 DIAGNOSIS — O34219 Maternal care for unspecified type scar from previous cesarean delivery: Secondary | ICD-10-CM

## 2013-10-10 DIAGNOSIS — I10 Essential (primary) hypertension: Secondary | ICD-10-CM

## 2013-10-10 DIAGNOSIS — IMO0002 Reserved for concepts with insufficient information to code with codable children: Secondary | ICD-10-CM

## 2013-10-10 LAB — CBC
HCT: 30.9 % — ABNORMAL LOW (ref 36.0–46.0)
MCH: 30.1 pg (ref 26.0–34.0)
MCHC: 35 g/dL (ref 30.0–36.0)
MCV: 86.1 fL (ref 78.0–100.0)
Platelets: 252 10*3/uL (ref 150–400)
RBC: 3.59 MIL/uL — ABNORMAL LOW (ref 3.87–5.11)
RDW: 13.7 % (ref 11.5–15.5)
WBC: 9.3 10*3/uL (ref 4.0–10.5)

## 2013-10-10 LAB — PREPARE RBC (CROSSMATCH)

## 2013-10-10 SURGERY — Surgical Case
Anesthesia: Spinal | Site: Abdomen | Wound class: Clean Contaminated

## 2013-10-10 MED ORDER — KETOROLAC TROMETHAMINE 30 MG/ML IJ SOLN: 30.0000 mg | Freq: Four times a day (QID) | INTRAMUSCULAR | Status: AC | PRN

## 2013-10-10 MED ORDER — DIPHENHYDRAMINE HCL 50 MG/ML IJ SOLN: 12.5000 mg | INTRAMUSCULAR | Status: DC | PRN

## 2013-10-10 MED ORDER — 0.9 % SODIUM CHLORIDE (POUR BTL) OPTIME
TOPICAL | Status: DC | PRN
  Administered 2013-10-10: 1000 mL

## 2013-10-10 MED ORDER — PRENATAL MULTIVITAMIN CH
1.0000 | ORAL_TABLET | Freq: Every day | ORAL | Status: DC
  Administered 2013-10-11 – 2013-10-12 (×2): 1 via ORAL
  Filled 2013-10-10 (×2): qty 1

## 2013-10-10 MED ORDER — NALOXONE HCL 1 MG/ML IJ SOLN
1.0000 ug/kg/h | INTRAVENOUS | Status: DC | PRN
  Filled 2013-10-10: qty 2

## 2013-10-10 MED ORDER — FENTANYL CITRATE 0.05 MG/ML IJ SOLN
INTRAMUSCULAR | Status: AC
  Filled 2013-10-10: qty 2

## 2013-10-10 MED ORDER — MENTHOL 3 MG MT LOZG: 1.0000 | LOZENGE | OROMUCOSAL | Status: DC | PRN

## 2013-10-10 MED ORDER — SIMETHICONE 80 MG PO CHEW
80.0000 mg | CHEWABLE_TABLET | ORAL | Status: DC
  Administered 2013-10-11 (×2): 80 mg via ORAL
  Filled 2013-10-10 (×2): qty 1

## 2013-10-10 MED ORDER — KETOROLAC TROMETHAMINE 60 MG/2ML IM SOLN
60.0000 mg | Freq: Once | INTRAMUSCULAR | Status: AC | PRN
  Administered 2013-10-10: 60 mg via INTRAMUSCULAR

## 2013-10-10 MED ORDER — OXYCODONE-ACETAMINOPHEN 5-325 MG PO TABS
1.0000 | ORAL_TABLET | ORAL | Status: DC | PRN
  Administered 2013-10-11 (×3): 2 via ORAL
  Administered 2013-10-11: 1 via ORAL
  Administered 2013-10-12 (×2): 2 via ORAL
  Filled 2013-10-10 (×3): qty 2
  Filled 2013-10-10: qty 1
  Filled 2013-10-10 (×3): qty 2

## 2013-10-10 MED ORDER — DIPHENHYDRAMINE HCL 25 MG PO CAPS: 25.0000 mg | ORAL_CAPSULE | Freq: Four times a day (QID) | ORAL | Status: DC | PRN

## 2013-10-10 MED ORDER — IBUPROFEN 600 MG PO TABS
600.0000 mg | ORAL_TABLET | Freq: Four times a day (QID) | ORAL | Status: DC
  Administered 2013-10-10 – 2013-10-12 (×8): 600 mg via ORAL
  Filled 2013-10-10 (×8): qty 1

## 2013-10-10 MED ORDER — KETOROLAC TROMETHAMINE 60 MG/2ML IM SOLN
INTRAMUSCULAR | Status: AC
  Administered 2013-10-10: 60 mg
  Filled 2013-10-10: qty 2

## 2013-10-10 MED ORDER — PHENYLEPHRINE 8 MG IN D5W 100 ML (0.08MG/ML) PREMIX OPTIME
INJECTION | INTRAVENOUS | Status: DC | PRN
  Administered 2013-10-10: 120 ug/min via INTRAVENOUS
  Administered 2013-10-10: 60 ug/min via INTRAVENOUS

## 2013-10-10 MED ORDER — TETANUS-DIPHTH-ACELL PERTUSSIS 5-2.5-18.5 LF-MCG/0.5 IM SUSP
0.5000 mL | Freq: Once | INTRAMUSCULAR | Status: AC
  Administered 2013-10-11: 0.5 mL via INTRAMUSCULAR
  Filled 2013-10-10: qty 0.5

## 2013-10-10 MED ORDER — METOCLOPRAMIDE HCL 5 MG/ML IJ SOLN: 10.0000 mg | Freq: Three times a day (TID) | INTRAMUSCULAR | Status: DC | PRN

## 2013-10-10 MED ORDER — DIBUCAINE 1 % RE OINT: 1.0000 "application " | TOPICAL_OINTMENT | RECTAL | Status: DC | PRN

## 2013-10-10 MED ORDER — ONDANSETRON HCL 4 MG/2ML IJ SOLN
INTRAMUSCULAR | Status: AC
  Filled 2013-10-10: qty 2

## 2013-10-10 MED ORDER — PHENYLEPHRINE HCL 10 MG/ML IJ SOLN
INTRAMUSCULAR | Status: AC
  Filled 2013-10-10: qty 1

## 2013-10-10 MED ORDER — CEFAZOLIN SODIUM-DEXTROSE 2-3 GM-% IV SOLR
INTRAVENOUS | Status: AC
  Filled 2013-10-10: qty 50

## 2013-10-10 MED ORDER — GLYCOPYRROLATE 0.2 MG/ML IJ SOLN
INTRAMUSCULAR | Status: DC | PRN
  Administered 2013-10-10: 0.2 mg via INTRAVENOUS
  Administered 2013-10-10: 0.1 mg via INTRAVENOUS

## 2013-10-10 MED ORDER — SIMETHICONE 80 MG PO CHEW
80.0000 mg | CHEWABLE_TABLET | Freq: Three times a day (TID) | ORAL | Status: DC
  Administered 2013-10-10 – 2013-10-12 (×6): 80 mg via ORAL
  Filled 2013-10-10 (×6): qty 1

## 2013-10-10 MED ORDER — MEPERIDINE HCL 25 MG/ML IJ SOLN: 6.2500 mg | INTRAMUSCULAR | Status: DC | PRN

## 2013-10-10 MED ORDER — ONDANSETRON HCL 4 MG PO TABS: 4.0000 mg | ORAL_TABLET | ORAL | Status: DC | PRN

## 2013-10-10 MED ORDER — LACTATED RINGERS IV SOLN
INTRAVENOUS | Status: DC
  Administered 2013-10-10 – 2013-10-11 (×2): via INTRAVENOUS

## 2013-10-10 MED ORDER — NALOXONE HCL 0.4 MG/ML IJ SOLN: 0.4000 mg | INTRAMUSCULAR | Status: DC | PRN

## 2013-10-10 MED ORDER — WITCH HAZEL-GLYCERIN EX PADS: 1.0000 "application " | MEDICATED_PAD | CUTANEOUS | Status: DC | PRN

## 2013-10-10 MED ORDER — MORPHINE SULFATE (PF) 0.5 MG/ML IJ SOLN
INTRAMUSCULAR | Status: DC | PRN
  Administered 2013-10-10: .1 mg via INTRATHECAL

## 2013-10-10 MED ORDER — MORPHINE SULFATE 0.5 MG/ML IJ SOLN
INTRAMUSCULAR | Status: AC
  Filled 2013-10-10: qty 10

## 2013-10-10 MED ORDER — DIPHENHYDRAMINE HCL 25 MG PO CAPS
25.0000 mg | ORAL_CAPSULE | ORAL | Status: DC | PRN
  Filled 2013-10-10: qty 1

## 2013-10-10 MED ORDER — BUPIVACAINE IN DEXTROSE 0.75-8.25 % IT SOLN
INTRATHECAL | Status: DC | PRN
  Administered 2013-10-10: 1.6 mL via INTRATHECAL

## 2013-10-10 MED ORDER — CEFAZOLIN SODIUM-DEXTROSE 2-3 GM-% IV SOLR
INTRAVENOUS | Status: DC | PRN
  Administered 2013-10-10: 2 g via INTRAVENOUS

## 2013-10-10 MED ORDER — LACTATED RINGERS IV SOLN
INTRAVENOUS | Status: DC | PRN
  Administered 2013-10-10: 14:00:00 via INTRAVENOUS

## 2013-10-10 MED ORDER — SIMETHICONE 80 MG PO CHEW: 80.0000 mg | CHEWABLE_TABLET | ORAL | Status: DC | PRN

## 2013-10-10 MED ORDER — FENTANYL CITRATE 0.05 MG/ML IJ SOLN: 25.0000 ug | INTRAMUSCULAR | Status: DC | PRN

## 2013-10-10 MED ORDER — FENTANYL CITRATE 0.05 MG/ML IJ SOLN
INTRAMUSCULAR | Status: DC | PRN
  Administered 2013-10-10: 15 ug via INTRATHECAL

## 2013-10-10 MED ORDER — SCOPOLAMINE 1 MG/3DAYS TD PT72
MEDICATED_PATCH | TRANSDERMAL | Status: AC
  Filled 2013-10-10: qty 1

## 2013-10-10 MED ORDER — OXYTOCIN 40 UNITS IN LACTATED RINGERS INFUSION - SIMPLE MED: 62.5000 mL/h | INTRAVENOUS | Status: AC

## 2013-10-10 MED ORDER — NALBUPHINE HCL 10 MG/ML IJ SOLN
5.0000 mg | INTRAMUSCULAR | Status: DC | PRN
  Filled 2013-10-10: qty 1

## 2013-10-10 MED ORDER — ONDANSETRON HCL 4 MG/2ML IJ SOLN
INTRAMUSCULAR | Status: DC | PRN
  Administered 2013-10-10: 4 mg via INTRAVENOUS

## 2013-10-10 MED ORDER — SENNOSIDES-DOCUSATE SODIUM 8.6-50 MG PO TABS
2.0000 | ORAL_TABLET | ORAL | Status: DC
  Administered 2013-10-11 (×2): 2 via ORAL
  Filled 2013-10-10 (×2): qty 2

## 2013-10-10 MED ORDER — SODIUM CHLORIDE 0.9 % IJ SOLN: 3.0000 mL | INTRAMUSCULAR | Status: DC | PRN

## 2013-10-10 MED ORDER — ONDANSETRON HCL 4 MG/2ML IJ SOLN: 4.0000 mg | Freq: Three times a day (TID) | INTRAMUSCULAR | Status: DC | PRN

## 2013-10-10 MED ORDER — LANOLIN HYDROUS EX OINT: 1.0000 "application " | TOPICAL_OINTMENT | CUTANEOUS | Status: DC | PRN

## 2013-10-10 MED ORDER — SCOPOLAMINE 1 MG/3DAYS TD PT72
1.0000 | MEDICATED_PATCH | Freq: Once | TRANSDERMAL | Status: DC
  Administered 2013-10-10: 1.5 mg via TRANSDERMAL

## 2013-10-10 MED ORDER — ONDANSETRON HCL 4 MG/2ML IJ SOLN: 4.0000 mg | INTRAMUSCULAR | Status: DC | PRN

## 2013-10-10 MED ORDER — OXYTOCIN 10 UNIT/ML IJ SOLN
INTRAMUSCULAR | Status: AC
  Filled 2013-10-10: qty 4

## 2013-10-10 MED ORDER — ZOLPIDEM TARTRATE 5 MG PO TABS: 5.0000 mg | ORAL_TABLET | Freq: Every evening | ORAL | Status: DC | PRN

## 2013-10-10 MED ORDER — OXYTOCIN 10 UNIT/ML IJ SOLN
40.0000 [IU] | INTRAVENOUS | Status: DC | PRN
  Administered 2013-10-10: 40 [IU] via INTRAVENOUS

## 2013-10-10 MED ORDER — DIPHENHYDRAMINE HCL 50 MG/ML IJ SOLN: 25.0000 mg | INTRAMUSCULAR | Status: DC | PRN

## 2013-10-10 SURGICAL SUPPLY — 30 items
BARRIER ADHS 3X4 INTERCEED (GAUZE/BANDAGES/DRESSINGS) IMPLANT
CLAMP CORD UMBIL (MISCELLANEOUS) IMPLANT
CLOTH BEACON ORANGE TIMEOUT ST (SAFETY) ×2 IMPLANT
DRAPE LG THREE QUARTER DISP (DRAPES) ×4 IMPLANT
DRSG OPSITE POSTOP 4X10 (GAUZE/BANDAGES/DRESSINGS) ×2 IMPLANT
DURAPREP 26ML APPLICATOR (WOUND CARE) ×2 IMPLANT
ELECT REM PT RETURN 9FT ADLT (ELECTROSURGICAL) ×2
ELECTRODE REM PT RTRN 9FT ADLT (ELECTROSURGICAL) ×1 IMPLANT
EXTRACTOR VACUUM KIWI (MISCELLANEOUS) IMPLANT
GAUZE SPONGE 4X4 12PLY STRL LF (GAUZE/BANDAGES/DRESSINGS) ×2 IMPLANT
GLOVE BIO SURGEON STRL SZ 6.5 (GLOVE) ×2 IMPLANT
GLOVE BIOGEL PI IND STRL 7.0 (GLOVE) ×1 IMPLANT
GLOVE BIOGEL PI INDICATOR 7.0 (GLOVE) ×1
GOWN PREVENTION PLUS XLARGE (GOWN DISPOSABLE) ×4 IMPLANT
GOWN STRL REIN XL XLG (GOWN DISPOSABLE) ×4 IMPLANT
KIT ABG SYR 3ML LUER SLIP (SYRINGE) IMPLANT
NEEDLE HYPO 25X5/8 SAFETYGLIDE (NEEDLE) IMPLANT
NS IRRIG 1000ML POUR BTL (IV SOLUTION) ×2 IMPLANT
PACK C SECTION WH (CUSTOM PROCEDURE TRAY) ×2 IMPLANT
PAD ABD 7.5X8 STRL (GAUZE/BANDAGES/DRESSINGS) ×2 IMPLANT
PAD OB MATERNITY 4.3X12.25 (PERSONAL CARE ITEMS) ×2 IMPLANT
RTRCTR C-SECT PINK 25CM LRG (MISCELLANEOUS) ×2 IMPLANT
SUT VIC AB 0 CT1 36 (SUTURE) ×12 IMPLANT
SUT VIC AB 2-0 CT1 27 (SUTURE) ×1
SUT VIC AB 2-0 CT1 TAPERPNT 27 (SUTURE) ×1 IMPLANT
SUT VIC AB 4-0 PS2 27 (SUTURE) ×2 IMPLANT
TAPE HYPAFIX 4 X10 (GAUZE/BANDAGES/DRESSINGS) ×2 IMPLANT
TOWEL OR 17X24 6PK STRL BLUE (TOWEL DISPOSABLE) ×2 IMPLANT
TRAY FOLEY CATH 14FR (SET/KITS/TRAYS/PACK) IMPLANT
WATER STERILE IRR 1000ML POUR (IV SOLUTION) ×2 IMPLANT

## 2013-10-10 NOTE — Anesthesia Procedure Notes (Signed)
Spinal  Patient location during procedure: OR Start time: 10/10/2013 1:18 PM Staffing Anesthesiologist: CASSIDY, AMY Performed by: anesthesiologist  Preanesthetic Checklist Completed: patient identified, site marked, surgical consent, pre-op evaluation, timeout performed, IV checked, risks and benefits discussed and monitors and equipment checked Spinal Block Patient position: sitting Prep: site prepped and draped and DuraPrep Patient monitoring: blood pressure, continuous pulse ox and heart rate Approach: midline Location: L3-4 Injection technique: single-shot Needle Needle type: Pencan  Needle gauge: 24 G Needle length: 10 cm Assessment Sensory level: T4 Additional Notes Clear free flow CSF on first attempt.  Left leg transient paresthesia.  Patient tolerated procedure well with no apparent complications.  Jasmine December, MD

## 2013-10-10 NOTE — Anesthesia Preprocedure Evaluation (Signed)
Anesthesia Evaluation  Patient identified by MRN, date of birth, ID band Patient awake    Reviewed: Allergy & Precautions, H&P , NPO status , Patient's Chart, lab work & pertinent test results, reviewed documented beta blocker date and time   History of Anesthesia Complications Negative for: history of anesthetic complications  Airway Mallampati: III TM Distance: >3 FB Neck ROM: full    Dental  (+) Teeth Intact   Pulmonary neg pulmonary ROS,  breath sounds clear to auscultation        Cardiovascular hypertension (CHTN with superimposed preeclampsia on magnesium), Rhythm:regular Rate:Normal     Neuro/Psych negative neurological ROS  negative psych ROS   GI/Hepatic negative GI ROS, Neg liver ROS,   Endo/Other  Morbid obesity  Renal/GU negative Renal ROS  negative genitourinary   Musculoskeletal   Abdominal   Peds  Hematology  (+) anemia ,   Anesthesia Other Findings   Reproductive/Obstetrics (+) Pregnancy (h/o c/s x1, failed TOLAC)                           Anesthesia Physical Anesthesia Plan  ASA: III  Anesthesia Plan: Spinal   Post-op Pain Management:    Induction:   Airway Management Planned:   Additional Equipment:   Intra-op Plan:   Post-operative Plan:   Informed Consent: I have reviewed the patients History and Physical, chart, labs and discussed the procedure including the risks, benefits and alternatives for the proposed anesthesia with the patient or authorized representative who has indicated his/her understanding and acceptance.     Plan Discussed with: Surgeon and CRNA  Anesthesia Plan Comments:         Anesthesia Quick Evaluation

## 2013-10-10 NOTE — MAU Provider Note (Signed)
Attestation of Attending Supervision of Advanced Practitioner (CNM/NP): Evaluation and management procedures were performed by the Advanced Practitioner under my supervision and collaboration.  I have reviewed the Advanced Practitioner's note and chart, and I agree with the management and plan.  Anastasija Anfinson 10/10/2013 12:10 PM   

## 2013-10-10 NOTE — Progress Notes (Signed)
Patient with failure to progress, requests repeat cesarean section. The procedure and the risk of bleeding, transfusion, infection, visceral organ damage, pain, anesthesia were discussed and her questions were answered.  Adam Phenix, MD 10/10/2013 12:15 PM

## 2013-10-10 NOTE — Transfer of Care (Signed)
Immediate Anesthesia Transfer of Care Note  Patient: Cindy Hendrix  Procedure(s) Performed: Procedure(s): CESAREAN SECTION (N/A)  Patient Location: PACU  Anesthesia Type:Spinal  Level of Consciousness: awake, alert  and oriented  Airway & Oxygen Therapy: Patient Spontanous Breathing  Post-op Assessment: Report given to PACU RN and Post -op Vital signs reviewed and stable  Post vital signs: Reviewed and stable  Complications: No apparent anesthesia complications

## 2013-10-10 NOTE — Op Note (Signed)
[name] PROCEDURE DATE: 10/08/2013 - 10/10/2013  PREOPERATIVE DIAGNOSES: Intrauterine pregnancy at  [redacted]w[redacted]d weeks gestation; failure to progress: arrest of dilation, repeat c/s, severe pre-e.   POSTOPERATIVE DIAGNOSES: The same  PROCEDURE: Repeat Low Transverse Cesarean Section  SURGEON:  Dr. Scheryl Darter  ASSISTANT:  Dr. Rulon Abide  ANESTHESIOLOGIST: Dr. Dana Allan  INDICATIONS: Cindy Hendrix is a 27 y.o. [id number] at [redacted]w[redacted]d here for cesarean section secondary to the indications listed under preoperative diagnoses; please see preoperative note for further details.  The risks of cesarean section were discussed with the patient including but were not limited to: bleeding which may require transfusion or reoperation; infection which may require antibiotics; injury to bowel, bladder, ureters or other surrounding organs; injury to the fetus; need for additional procedures including hysterectomy in the event of a life-threatening hemorrhage; placental abnormalities wth subsequent pregnancies, incisional problems, thromboembolic phenomenon and other postoperative/anesthesia complications.   The patient concurred with the proposed plan, giving informed written consent for the procedure.    FINDINGS:  Viable female infant in cephalic presentation.  Apgars 7 and 9.  Clear amniotic fluid.  Intact placenta, three vessel cord.  Normal uterus, fallopian tubes and ovaries bilaterally.  ANESTHESIA: Spinal INTRAVENOUS FLUIDS: 1400 ml ESTIMATED BLOOD LOSS: 600 ml URINE OUTPUT:  75 ml SPECIMENS: Placenta sent to L&D COMPLICATIONS: None immediate  PROCEDURE IN DETAIL:  The patient preoperatively received intravenous antibiotics and had sequential compression devices applied to her lower extremities.  She was then taken to the operating room where spinal anesthesia was administered. She was then placed in a dorsal supine position with a leftward tilt, and prepped and draped in a sterile manner.  A foley  catheter was placed into her bladder and attached to constant gravity.  After an adequate timeout was performed, a Pfannenstiel skin incision was made with scalpel and carried through to the underlying layer of fascia. The fascia was incised in the midline, and this incision was extended bilaterally using the Mayo scissors.  Kocher clamps were applied to the superior aspect of the fascial incision and the underlying rectus muscles were dissected off bluntly. A similar process was carried out on the inferior aspect of the fascial incision. The rectus muscles were separated in the midline bluntly and the peritoneum was entered bluntly. Attention was turned to the lower uterine segment where a low transverse hysterotomy was made with a scalpel and extended bilaterally bluntly.  The infant was successfully delivered via vacuum delivery, the cord was clamped and cut and the infant was handed over to awaiting neonatology team. Uterine massage was then administered, and the placenta delivered intact with a three-vessel cord. The uterus was then cleared of clot and debris.  The hysterotomy was closed with 0 Vicryl in a running locked fashion, and an imbricating layer was also placed with 0 Vicryl. The pelvis was cleared of all clot and debris. Hemostasis was confirmed on all surfaces.  The peritoneum and the muscles were reapproximated using 0 Vicryl interrupted stitches. The fascia was then closed using 0 Vicryl in a running fashion.  The subcutaneous layer was irrigated.  The skin was closed with a 4-0 Vicryl subcuticular stitch. The patient tolerated the procedure well. Sponge, lap, instrument and needle counts were correct x 2.  She was taken to the recovery room in stable condition.

## 2013-10-10 NOTE — Progress Notes (Signed)
   Merinda Victorino is a 27 y.o. G3P1011 at [redacted]w[redacted]d  admitted for induction of labor due to Pre-eclamptic toxemia of pregnancy..  Subjective: sleeping  Objective: BP 139/88  Pulse 67  Temp(Src) 98.3 F (36.8 C) (Oral)  Resp 18  Ht 5\' 9"  (1.753 m)  Wt 109.226 kg (240 lb 12.8 oz)  BMI 35.54 kg/m2  SpO2 100% Total I/O In: 1670.4 [P.O.:420; I.V.:1250.4] Out: 1450 [Urine:1450]  FHT:  FHR: 130 bpm, variability: moderate,  accelerations:  Present,  decelerations:  Absent UC:   irregular, every 3-7 minutes SVE:   Dilation: 4.5 Effacement (%): 60 Station: -3 Exam by:: N Psychologist, counselling F cresenzo-Dishmon, CNM Attempted AROM without success despite optimal positioning and fundal pressure Pitocin @ 16 mu/min  Labs: Lab Results  Component Value Date   WBC 9.7 10/08/2013   HGB 11.2* 10/08/2013   HCT 32.1* 10/08/2013   MCV 85.4 10/08/2013   PLT 265 10/08/2013    Assessment / Plan: IOL for preeclampsia, not in labor Will ask attending MD to attempt AROM  Labor: no Fetal Wellbeing:  Category I Pain Control:  Labor support without medications Anticipated MOD:  NSVD  CRESENZO-DISHMAN,Liliauna Santoni 10/10/2013, 5:05 AM

## 2013-10-10 NOTE — H&P (Signed)
Attestation of Attending Supervision of Advanced Practitioner (CNM/NP): Evaluation and management procedures were performed by the Advanced Practitioner under my supervision and collaboration.  I have reviewed the Advanced Practitioner's note and chart, and I agree with the management and plan.  Alisha Bacus 10/10/2013 12:10 PM   

## 2013-10-10 NOTE — Preoperative (Signed)
Beta Blockers   Reason not to administer Beta Blockers:Not Applicable 

## 2013-10-10 NOTE — Anesthesia Postprocedure Evaluation (Signed)
  Anesthesia Post-op Note  Anesthesia Post Note  Patient: Cindy Hendrix  Procedure(s) Performed: Procedure(s) (LRB): CESAREAN SECTION (N/A)  Anesthesia type: Spinal  Patient location: PACU  Post pain: Pain level controlled  Post assessment: Post-op Vital signs reviewed  Last Vitals:  Filed Vitals:   10/10/13 1545  BP: 137/96  Pulse: 62  Temp:   Resp: 18    Post vital signs: Reviewed  Level of consciousness: awake  Complications: No apparent anesthesia complications

## 2013-10-11 LAB — CBC
HCT: 25.7 % — ABNORMAL LOW (ref 36.0–46.0)
Hemoglobin: 8.9 g/dL — ABNORMAL LOW (ref 12.0–15.0)
MCH: 30.1 pg (ref 26.0–34.0)
MCHC: 34.6 g/dL (ref 30.0–36.0)
MCV: 86.8 fL (ref 78.0–100.0)
RBC: 2.96 MIL/uL — ABNORMAL LOW (ref 3.87–5.11)
WBC: 10 10*3/uL (ref 4.0–10.5)

## 2013-10-11 LAB — CULTURE, BETA STREP (GROUP B ONLY)
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
## 2013-10-11 NOTE — Lactation Note (Signed)
This note was copied from the chart of Cindy Shemeka Wardle. Lactation Consultation Note: Initial visit with mom. She reports that baby has been nursing well but she just gave some formula due to double phototherapy. Encouraged to always BF first, then give formula to promote a good milk supply. Mom reports that she can obtain some Colostrum by hand expression. BF brochure given with resources for support after DC. No questions at present. To call prn  Patient Name: Cindy Hendrix Today's Date: 10/11/2013 Reason for consult: Initial assessment   Maternal Data Formula Feeding for Exclusion: Yes Reason for exclusion: Admission to Intensive Care Unit (ICU) post-partum Infant to breast within first hour of birth: Yes Does the patient have breastfeeding experience prior to this delivery?: Yes  Feeding Feeding Type: Breast Fed Length of feed: 60 min  LATCH Score/Interventions Latch: Grasps breast easily, tongue down, lips flanged, rhythmical sucking.  Audible Swallowing: None Intervention(s):  (on bilii lights)  Type of Nipple: Everted at rest and after stimulation  Comfort (Breast/Nipple): Soft / non-tender     Hold (Positioning): No assistance needed to correctly position infant at breast.  LATCH Score: 8  Lactation Tools Discussed/Used     Consult Status Consult Status: Follow-up Date: 10/12/13 Follow-up type: In-patient    Pamelia Hoit 10/11/2013, 2:24 PM

## 2013-10-11 NOTE — Progress Notes (Signed)
Post Partum Day 1 Subjective: No complaints, up ad lib, voiding, tolerating PO and + flatus.  Denies any preeclampsia symptoms.  Baby is doing well, under bilirubin lights at patient's bedside.  Objective: Blood pressure 129/76, pulse 57, temperature 98.1 F (36.7 C), temperature source Oral, resp. rate 18, height 5\' 9"  (1.753 m), weight 235 lb 1.6 oz (106.641 kg), SpO2 100.00%, unknown if currently breastfeeding. Temp:  [97.3 F (36.3 C)-98.6 F (37 C)] 98.1 F (36.7 C) (10/25 0300) Pulse Rate:  [54-79] 57 (10/25 0700) Resp:  [13-21] 18 (10/25 0600) BP: (81-149)/(14-103) 129/76 mmHg (10/25 0800) SpO2:  [92 %-100 %] 100 % (10/25 0600) Weight:  [235 lb 1.6 oz (106.641 kg)-237 lb 1.6 oz (107.548 kg)] 235 lb 1.6 oz (106.641 kg) (10/25 0600)  Physical Exam:  General: alert and no distress Lochia: appropriate Lungs: CTAB Uterine Fundus: firm Ext: 2+ DTRs,  No evidence of DVT seen on physical exam.  Negative Homan's sign. No significant calf/ankle edema.  Results for orders placed during the hospital encounter of 10/08/13 (from the past 24 hour(s))  CBC     Status: Abnormal   Collection Time    10/11/13  5:07 AM      Result Value Range   WBC 10.0  4.0 - 10.5 K/uL   RBC 2.96 (*) 3.87 - 5.11 MIL/uL   Hemoglobin 8.9 (*) 12.0 - 15.0 g/dL   HCT 16.1 (*) 09.6 - 04.5 %   MCV 86.8  78.0 - 100.0 fL   MCH 30.1  26.0 - 34.0 pg   MCHC 34.6  30.0 - 36.0 g/dL   RDW 40.9  81.1 - 91.4 %   Platelets 222  150 - 400 K/uL   Assessment/Plan: Breastfeeding Undecided postpartum BCM Magnesium sulfate to stop at 1340, will observe and transfer to floor as per protocol BP stable, no hypertensives needed for now.  Labetalol discontinued due to low HR.  Will add Norvasc if needed later (after magnesium sulfate)  Routine postpartum care   LOS: 3 days   Tereso Newcomer, MD 10/11/2013, 9:22 AM

## 2013-10-11 NOTE — Progress Notes (Signed)
Feeding baby-BP cuff off

## 2013-10-12 LAB — TYPE AND SCREEN
ABO/RH(D): O POS
Antibody Screen: NEGATIVE
Unit division: 0
Unit division: 0

## 2013-10-12 MED ORDER — OXYCODONE-ACETAMINOPHEN 5-325 MG PO TABS: 1.0000 | ORAL_TABLET | ORAL | Status: AC | PRN

## 2013-10-12 MED ORDER — IBUPROFEN 800 MG PO TABS: 800.0000 mg | ORAL_TABLET | Freq: Three times a day (TID) | ORAL | Status: AC | PRN

## 2013-10-12 NOTE — Discharge Summary (Signed)
Obstetric Discharge Summary Reason for Admission: chronic HTN with superimposed severe pre eclampsia at term Prenatal Procedures: ultrasound Intrapartum Procedures: cesarean: low cervical, transverse, repeat Postpartum Procedures: none Complications-Operative and Postpartum: none Hemoglobin  Date Value Range Status  10/11/2013 8.9* 12.0 - 15.0 g/dL Final     REPEATED TO VERIFY     DELTA CHECK NOTED     HCT  Date Value Range Status  10/11/2013 25.7* 36.0 - 46.0 % Final    Physical Exam:  General: alert Lochia: appropriate Uterine Fundus: firm Incision: healing well DVT Evaluation: No evidence of DVT seen on physical exam.  She had her RLTCS for FTP. She did have a TOLAC. She was given Magnesium during labor and for 24 hours post partum. Her BPs were only slightly evelvated by the day of discharge. She was requesting discharge home on POD #2. Social work consult was obtained and they anticipated no problems.  Discharge Diagnoses: Term Pregnancy-delivered and Preelampsia  Discharge Information: Date: 10/12/2013 Activity: pelvic rest Diet: routine Medications: Ibuprofen and Percocet Condition: stable Instructions: refer to practice specific booklet Discharge to: home Follow-up Information   Follow up with [facility]. Schedule an appointment as soon as possible for a visit in 2 weeks. (for depo provera and in 6 weeks for pp visit)    Contact information:   [address] [place] [address] [phone]      Newborn Data: Live born female  Birth Weight: 6 lb 13.5 oz (3104 g) APGAR: 7, 9  Home with mother.  Cindy Hendrix C. 10/12/2013, 9:16 AM

## 2013-10-13 ENCOUNTER — Encounter (HOSPITAL_COMMUNITY): Payer: Self-pay | Admitting: Obstetrics & Gynecology

## 2013-10-13 NOTE — Progress Notes (Signed)
Post discharge chart review completed.  

## 2013-11-06 ENCOUNTER — Ambulatory Visit: Payer: Medicaid Other | Admitting: Advanced Practice Midwife

## 2014-10-19 ENCOUNTER — Encounter (HOSPITAL_COMMUNITY): Payer: Self-pay | Admitting: Obstetrics & Gynecology

## 2021-03-14 ENCOUNTER — Encounter (HOSPITAL_BASED_OUTPATIENT_CLINIC_OR_DEPARTMENT_OTHER): Payer: Self-pay

## 2021-03-14 ENCOUNTER — Emergency Department (HOSPITAL_BASED_OUTPATIENT_CLINIC_OR_DEPARTMENT_OTHER)
Admission: EM | Admit: 2021-03-14 | Discharge: 2021-03-14 | Disposition: A | Discharge status: Home / Self Care | Payer: Medicaid Other | Attending: Emergency Medicine | Admitting: Emergency Medicine

## 2021-03-14 ENCOUNTER — Other Ambulatory Visit: Payer: Self-pay

## 2021-03-14 DIAGNOSIS — I10 Essential (primary) hypertension: Secondary | ICD-10-CM | POA: Diagnosis not present

## 2021-03-14 DIAGNOSIS — K0889 Other specified disorders of teeth and supporting structures: Secondary | ICD-10-CM | POA: Diagnosis present

## 2021-03-14 DIAGNOSIS — E876 Hypokalemia: Secondary | ICD-10-CM | POA: Diagnosis not present

## 2021-03-14 LAB — CBC WITH DIFFERENTIAL/PLATELET
Abs Immature Granulocytes: 0.05 10*3/uL (ref 0.00–0.07)
Basophils Absolute: 0 10*3/uL (ref 0.0–0.1)
Basophils Relative: 0 %
Eosinophils Absolute: 0 10*3/uL (ref 0.0–0.5)
Eosinophils Relative: 0 %
HCT: 33.1 % — ABNORMAL LOW (ref 36.0–46.0)
Hemoglobin: 10.8 g/dL — ABNORMAL LOW (ref 12.0–15.0)
Immature Granulocytes: 1 %
Lymphocytes Relative: 10 %
Lymphs Abs: 1 10*3/uL (ref 0.7–4.0)
MCH: 27 pg (ref 26.0–34.0)
MCHC: 32.6 g/dL (ref 30.0–36.0)
MCV: 82.8 fL (ref 80.0–100.0)
Monocytes Absolute: 0.4 10*3/uL (ref 0.1–1.0)
Monocytes Relative: 4 %
Neutro Abs: 8.5 10*3/uL — ABNORMAL HIGH (ref 1.7–7.7)
Neutrophils Relative %: 85 %
Platelets: 317 10*3/uL (ref 150–400)
RBC: 4 MIL/uL (ref 3.87–5.11)
RDW: 17.2 % — ABNORMAL HIGH (ref 11.5–15.5)
WBC: 10 10*3/uL (ref 4.0–10.5)
nRBC: 0 % (ref 0.0–0.2)

## 2021-03-14 LAB — BASIC METABOLIC PANEL
Anion gap: 12 (ref 5–15)
BUN: 10 mg/dL (ref 6–20)
CO2: 21 mmol/L — ABNORMAL LOW (ref 22–32)
Calcium: 9.4 mg/dL (ref 8.9–10.3)
Chloride: 100 mmol/L (ref 98–111)
Creatinine, Ser: 0.91 mg/dL (ref 0.44–1.00)
GFR, Estimated: >60 mL/min (ref >60)
Glucose, Bld: 109 mg/dL — ABNORMAL HIGH (ref 70–99)
Potassium: 3.2 mmol/L — ABNORMAL LOW (ref 3.5–5.1)
Sodium: 133 mmol/L — ABNORMAL LOW (ref 135–145)

## 2021-03-14 MED ORDER — AMLODIPINE BESYLATE 5 MG PO TABS: 5.0000 mg | ORAL_TABLET | Freq: Every day | ORAL | 0 refills | Status: AC

## 2021-03-14 MED ORDER — POTASSIUM CHLORIDE CRYS ER 20 MEQ PO TBCR
40.0000 meq | EXTENDED_RELEASE_TABLET | Freq: Once | ORAL | Status: AC
  Administered 2021-03-14: 40 meq via ORAL
  Filled 2021-03-14: qty 2

## 2021-03-14 MED ORDER — AMOXICILLIN 500 MG PO CAPS: 500.0000 mg | ORAL_CAPSULE | Freq: Three times a day (TID) | ORAL | 0 refills | Status: DC

## 2021-03-14 MED ORDER — IBUPROFEN 800 MG PO TABS
800.0000 mg | ORAL_TABLET | Freq: Once | ORAL | Status: AC
  Administered 2021-03-14: 800 mg via ORAL
  Filled 2021-03-14: qty 1

## 2021-03-14 MED ORDER — OXYCODONE-ACETAMINOPHEN 5-325 MG PO TABS
1.0000 | ORAL_TABLET | Freq: Once | ORAL | Status: AC
  Administered 2021-03-14: 1 via ORAL
  Filled 2021-03-14: qty 1

## 2021-03-14 MED ORDER — AMLODIPINE BESYLATE 5 MG PO TABS: 5.0000 mg | ORAL_TABLET | Freq: Every day | ORAL | 0 refills | Status: DC

## 2021-03-14 MED ORDER — IBUPROFEN 800 MG PO TABS: 800.0000 mg | ORAL_TABLET | Freq: Three times a day (TID) | ORAL | 0 refills | Status: AC

## 2021-03-14 MED ORDER — IBUPROFEN 800 MG PO TABS: 800.0000 mg | ORAL_TABLET | Freq: Three times a day (TID) | ORAL | 0 refills | Status: DC

## 2021-03-14 MED ORDER — HYDRALAZINE HCL 20 MG/ML IJ SOLN
10.0000 mg | Freq: Once | INTRAMUSCULAR | Status: AC
  Administered 2021-03-14: 10 mg via INTRAVENOUS
  Filled 2021-03-14: qty 1

## 2021-03-14 MED ORDER — AMOXICILLIN 500 MG PO CAPS: 500.0000 mg | ORAL_CAPSULE | Freq: Three times a day (TID) | ORAL | 0 refills | Status: AC

## 2021-03-14 NOTE — Discharge Instructions (Signed)
Please pick up blood pressure medication and take as prescribed. You will need to follow up with a PCP for reevaluation and to represcribe medication for you by the end of the month. It is very important to start taking this medication regularly to prevent organ damage with continued elevated blood pressure.   It is recommended that you buy a blood pressure cuff and keep a log of your blood pressure readings to take with you when you see PCP.   Your potassium level was also slightly decreased today. We have repleted it in the ED however you will need to have your potassium level rechecked in 1-2 weeks.   Follow up with Dr. [facility] dentistry for further evaluation of your dental pain. You can also follow up with others listed on the dental resource guide. You will need to have the tooth removed that is causing your pain. I have prescribed Ibuprofen for you to take as needed for pain as well as an antibiotic to cover for infection.   Return to the ED for any worsening symptoms

## 2021-03-14 NOTE — ED Provider Notes (Signed)
[facility] EMERGENCY DEPARTMENT Provider Note   CSN: [id number] Arrival date & time: 03/14/21  1846     History Chief Complaint  Patient presents with  . Dental Pain    Cindy Hendrix is a 35 y.o. female who presents to the ED today with complaint of sudden onset, constant, worsening, right lower dental pain that began today around lunch time.  Reports she has been dealing with a fractured wisdom tooth for several months however today she was eating a salad with croutons when she bit down and felt a sharp pain.  She states she felt something in her mouth that seemed harder than the crouton and spit it out and it was the remainder of her tooth.  Since then she has been having worsening excruciating pain causing a headache as well.  She has taken multiple over-the-counter medications without relief prompting her to come to the ED today.  On arrival to the ED her blood pressure is noted to be significantly elevated to 21/127.  Does appear patient has a history of high blood pressure but is not on any medications for same.  She does not typically see a PCP yearly for a physical.  Reports that the last time she went several years ago while she was in her 75s she was told that she had "stroke level high blood pressure" however continues to not be on any medications for same.   The history is provided by the patient and medical records.  Dental Pain Associated symptoms: no fever        Past Medical History:  Diagnosis Date  . Pregnancy induced hypertension     Patient Active Problem List   Diagnosis Date Noted  . Essential hypertension, benign 06/30/2013    Past Surgical History:  Procedure Laterality Date  . CESAREAN SECTION    . CESAREAN SECTION N/A 10/10/2013   Procedure: CESAREAN SECTION;  Surgeon: Adam Phenix, MD;  Location: [facility];  Service: Obstetrics;  Laterality: N/A;     OB History    Gravida  3   Para  2   Term  2   Preterm  0   AB  1   Living  2      SAB  0   IAB  1   Ectopic  0   Multiple  0   Live Births  2           Family History  Problem Relation Age of Onset  . Cancer Maternal Grandfather 79       Colon    Social History   Tobacco Use  . Smoking status: Never Smoker  . Smokeless tobacco: Never Used  Substance Use Topics  . Alcohol use: No  . Drug use: Yes    Types: Marijuana    Home Medications Prior to Admission medications   Medication Sig Start Date End Date Taking? Authorizing Provider  amLODipine (NORVASC) 5 MG tablet Take 1 tablet (5 mg total) by mouth daily. 03/14/21 04/13/21 Yes Natalija Mavis, PA-C  amoxicillin (AMOXIL) 500 MG capsule Take 1 capsule (500 mg total) by mouth 3 (three) times daily. 03/14/21  Yes Dreanna Kyllo, PA-C  ibuprofen (ADVIL) 800 MG tablet Take 1 tablet (800 mg total) by mouth 3 (three) times daily. 03/14/21  Yes Bhavesh Vazquez, PA-C  ibuprofen (ADVIL,MOTRIN) 800 MG tablet Take 1 tablet (800 mg total) by mouth every 8 (eight) hours as needed for pain. 10/12/13   Allie Bossier, MD  oxyCODONE-acetaminophen (  PERCOCET/ROXICET) 5-325 MG per tablet Take 1-2 tablets by mouth every 4 (four) hours as needed. 10/12/13   Allie Bossierove, Myra C, MD  Prenatal Vit-Fe Fumarate-FA (PRENATAL MULTIVITAMIN) TABS tablet Take 1 tablet by mouth daily at 12 noon.    [provider]  Pseudoephedrine-Acetaminophen (SINUS PO) Take 1 tablet by mouth daily as needed.    [provider]    Allergies    Lisinopril  Review of Systems   Review of Systems  Constitutional: Negative for chills and fever.  HENT: Positive for dental problem.   Respiratory: Negative for shortness of breath.   Cardiovascular: Negative for chest pain.  All other systems reviewed and are negative.   Physical Exam Updated Vital Signs BP (!) 264/133 (BP Location: Left Arm) Comment: PA aware  Pulse 68   Temp 99.8 F (37.7 C) (Oral)   Resp 18   Ht 5\' 8"  (1.727 m)   Wt 72.6 kg   SpO2 99%   BMI 24.33 kg/m    Physical Exam Vitals and nursing note reviewed.  Constitutional:      Appearance: She is not ill-appearing or diaphoretic.  HENT:     Head: Normocephalic and atraumatic.     Mouth/Throat:     Comments: Nose clear.  R lower tooth #32 significantly decayed and fractured to the gumline with caries with TTP, with minimal surrounding gingival swelling and erythema, no definite abscess, no evidence of ludwig's.  Oropharynx clear and moist, without uvular swelling or deviation, no trismus or drooling, no tonsillar swelling or erythema, no exudates.   Eyes:     Extraocular Movements: Extraocular movements intact.     Conjunctiva/sclera: Conjunctivae normal.     Pupils: Pupils are equal, round, and reactive to light.  Cardiovascular:     Rate and Rhythm: Normal rate and regular rhythm.     Pulses: Normal pulses.  Pulmonary:     Effort: Pulmonary effort is normal.     Breath sounds: Normal breath sounds. No wheezing, rhonchi or rales.  Abdominal:     Palpations: Abdomen is soft.     Tenderness: There is no abdominal tenderness. There is no guarding or rebound.  Musculoskeletal:     Cervical back: Neck supple.  Skin:    General: Skin is warm and dry.  Neurological:     Mental Status: She is alert.     Comments: Alert and oriented to self, place, time and event.   Speech is fluent, clear without dysarthria or dysphasia.   Strength 5/5 in upper/lower extremities  Sensation intact in upper/lower extremities   Normal gait.  Negative Romberg. No pronator drift.  Normal finger-to-nose and feet tapping.  CN I not tested  CN II grossly intact visual fields bilaterally. Did not visualize posterior eye.   CN III, IV, VI PERRLA and EOMs intact bilaterally  CN V Intact sensation to sharp and light touch to the face  CN VII facial movements symmetric  CN VIII not tested  CN IX, X no uvula deviation, symmetric rise of soft palate  CN XI 5/5 SCM and trapezius strength bilaterally  CN XII  Midline tongue protrusion, symmetric L/R movements      ED Results / Procedures / Treatments   Labs (all labs ordered are listed, but only abnormal results are displayed) Labs Reviewed  BASIC METABOLIC PANEL - Abnormal; Notable for the following components:      Result Value   Sodium 133 (*)    Potassium 3.2 (*)    CO2  21 (*)    Glucose, Bld 109 (*)    All other components within normal limits  CBC WITH DIFFERENTIAL/PLATELET - Abnormal; Notable for the following components:   Hemoglobin 10.8 (*)    HCT 33.1 (*)    RDW 17.2 (*)    Neutro Abs 8.5 (*)    All other components within normal limits    EKG EKG Interpretation  Date/Time:  Monday March 14 2021 22:07:25 EDT Ventricular Rate:  54 PR Interval:    QRS Duration: 125 QT Interval:  483 QTC Calculation: 458 R Axis:   26 Text Interpretation: Sinus rhythm IVCD, consider atypical RBBB Probable left ventricular hypertrophy Anterolateral Q wave, probably normal for age No old tracing to compare Confirmed by Meridee Score [phone]) on 03/14/2021 10:09:22 PM   Radiology No results found.  Procedures Procedures   Medications Ordered in ED Medications  potassium chloride SA (KLOR-CON) CR tablet 40 mEq (has no administration in time range)  ibuprofen (ADVIL) tablet 800 mg (has no administration in time range)  oxyCODONE-acetaminophen (PERCOCET/ROXICET) 5-325 MG per tablet 1 tablet (1 tablet Oral Given 03/14/21 2203)  hydrALAZINE (APRESOLINE) injection 10 mg (10 mg Intravenous Given 03/14/21 2218)    ED Course  I have reviewed the triage vital signs and the nursing notes.  Pertinent labs & imaging results that were available during my care of the patient were reviewed by me and considered in my medical decision making (see chart for details).    MDM Rules/Calculators/A&P                          35 year old female who presents to the ED today complaining of right lower dental pain after biting into a crouton earlier today  causing her tooth to fracture.  She has been dealing with dental pain for several months, does not currently have a dentist.  On arrival to the ED his blood pressure is significantly elevated at 221/127.  Repeat when she is brought back to her room after a couple of hours in the waiting room at 264/133.  She reports that she was told several years ago that she had "stroke level" high blood pressure however continues to not be on any medications for same.  Does not have a PCP and does not get a yearly physical.  She has no focal neuro deficits on exam today.  She does complain of a headache however feels like it is radiating from her tooth and did not start until earlier today.  Suspect blood pressure may be slightly elevated secondary to pain however I do suspect patient has underlying very uncontrolled high blood pressure.  Will obtain lab work at this time including a CBC, BMP, EKG.  Will provide IV hydralazine to see if we can help with blood pressure.  We will also provide pain medication and recheck blood pressure.  Patient does have a fractured wisdom tooth on the right lower aspect, fractured to the gumline with significant decay and tenderness palpation.  She will need to see a dentist for same.   Labwork unremarkable at this time besides hypokalemia 3.2; will replete. Creatinine is stable.  Blood pressure slightly improved at 210/111. Per chart review pt has had multiple ED visits recently with significantly elevated blood pressure. Given she does not appear to be having signs of end organ damage do not feel she requires admission at this time. Have discussed with attending physician Dr. Charm Barges who agrees.   On reevaluation  pt reports no improvement with percocet. She is requesting 800 mg Ibuprofen which I think is reasonable. Will provide and plan to discharge. Pt will need to follow up with dentist for removal of fractured tooth. Will discharge with Ibuprofen Rx as well as Amlodipine. Pt instructed  she will need to follow up with PCP for reevaluation of high blood pressure. She has [facility] listed as her PCP however states she has not seen them in years; advised she will need to reestablish care. She is in agreement with plan and stable for discharge.   This note was prepared using Dragon voice recognition software and may include unintentional dictation errors due to the inherent limitations of voice recognition software.  Final Clinical Impression(s) / ED Diagnoses Final diagnoses:  Pain, dental  Primary hypertension  Hypokalemia    Rx / DC Orders ED Discharge Orders         Ordered    ibuprofen (ADVIL) 800 MG tablet  3 times daily        03/14/21 2338    amLODipine (NORVASC) 5 MG tablet  Daily        03/14/21 2338    amoxicillin (AMOXIL) 500 MG capsule  3 times daily        03/14/21 2341           Discharge Instructions     Please pick up blood pressure medication and take as prescribed. You will need to follow up with a PCP for reevaluation and to represcribe medication for you by the end of the month. It is very important to start taking this medication regularly to prevent organ damage with continued elevated blood pressure.   It is recommended that you buy a blood pressure cuff and keep a log of your blood pressure readings to take with you when you see PCP.   Your potassium level was also slightly decreased today. We have repleted it in the ED however you will need to have your potassium level rechecked in 1-2 weeks.   Follow up with Dr. [facility] for further evaluation of your dental pain. You can also follow up with others listed on the dental resource guide. You will need to have the tooth removed that is causing your pain. I have prescribed Ibuprofen for you to take as needed for pain as well as an antibiotic to cover for infection.   Return to the ED for any worsening symptoms       Tanda Rockers, Cordelia Poche 03/14/21 2341    Terrilee Files, MD 03/15/21 1051

## 2021-03-14 NOTE — ED Triage Notes (Signed)
Back bottom L tooth pain/swelling.  Pt does endorse facial pain radiating to shoulder

## 2022-04-28 ENCOUNTER — Other Ambulatory Visit: Payer: Self-pay

## 2022-04-28 ENCOUNTER — Emergency Department (HOSPITAL_COMMUNITY): Payer: Medicaid Other

## 2022-04-28 ENCOUNTER — Encounter (HOSPITAL_COMMUNITY): Payer: Self-pay

## 2022-04-28 ENCOUNTER — Emergency Department (HOSPITAL_COMMUNITY)
Admission: EM | Admit: 2022-04-28 | Discharge: 2022-04-29 | Disposition: A | Discharge status: Home / Self Care | Payer: Medicaid Other | Attending: Emergency Medicine | Admitting: Emergency Medicine

## 2022-04-28 DIAGNOSIS — O26891 Other specified pregnancy related conditions, first trimester: Secondary | ICD-10-CM | POA: Diagnosis present

## 2022-04-28 DIAGNOSIS — M542 Cervicalgia: Secondary | ICD-10-CM | POA: Diagnosis not present

## 2022-04-28 DIAGNOSIS — O99891 Other specified diseases and conditions complicating pregnancy: Secondary | ICD-10-CM | POA: Insufficient documentation

## 2022-04-28 DIAGNOSIS — R519 Headache, unspecified: Secondary | ICD-10-CM | POA: Insufficient documentation

## 2022-04-28 DIAGNOSIS — Z3A12 12 weeks gestation of pregnancy: Secondary | ICD-10-CM | POA: Insufficient documentation

## 2022-04-28 DIAGNOSIS — R0789 Other chest pain: Secondary | ICD-10-CM | POA: Diagnosis not present

## 2022-04-28 DIAGNOSIS — Y92410 Unspecified street and highway as the place of occurrence of the external cause: Secondary | ICD-10-CM | POA: Insufficient documentation

## 2022-04-28 LAB — COMPREHENSIVE METABOLIC PANEL
ALT: 15 U/L (ref 0–44)
AST: 15 U/L (ref 15–41)
Albumin: 3.6 g/dL (ref 3.5–5.0)
Alkaline Phosphatase: 37 U/L — ABNORMAL LOW (ref 38–126)
Anion gap: 9 (ref 5–15)
BUN: 10 mg/dL (ref 6–20)
CO2: 21 mmol/L — ABNORMAL LOW (ref 22–32)
Calcium: 9.1 mg/dL (ref 8.9–10.3)
Chloride: 105 mmol/L (ref 98–111)
Creatinine, Ser: 0.82 mg/dL (ref 0.44–1.00)
GFR, Estimated: >60 mL/min (ref >60)
Glucose, Bld: 80 mg/dL (ref 70–99)
Potassium: 3.3 mmol/L — ABNORMAL LOW (ref 3.5–5.1)
Sodium: 135 mmol/L (ref 135–145)
Total Bilirubin: 0.2 mg/dL — ABNORMAL LOW (ref 0.3–1.2)
Total Protein: 6.8 g/dL (ref 6.5–8.1)

## 2022-04-28 LAB — URINALYSIS, ROUTINE W REFLEX MICROSCOPIC
Bilirubin Urine: NEGATIVE
Glucose, UA: NEGATIVE mg/dL
Ketones, ur: NEGATIVE mg/dL
Leukocytes,Ua: NEGATIVE
Nitrite: NEGATIVE
Protein, ur: NEGATIVE mg/dL
Specific Gravity, Urine: 1.016 (ref 1.005–1.030)
pH: 7 (ref 5.0–8.0)

## 2022-04-28 LAB — CBC WITH DIFFERENTIAL/PLATELET
Abs Immature Granulocytes: 0.07 10*3/uL (ref 0.00–0.07)
Basophils Absolute: 0 10*3/uL (ref 0.0–0.1)
Basophils Relative: 0 %
Eosinophils Absolute: 0.1 10*3/uL (ref 0.0–0.5)
Eosinophils Relative: 1 %
HCT: 30.4 % — ABNORMAL LOW (ref 36.0–46.0)
Hemoglobin: 10.6 g/dL — ABNORMAL LOW (ref 12.0–15.0)
Immature Granulocytes: 1 %
Lymphocytes Relative: 15 %
Lymphs Abs: 1.9 10*3/uL (ref 0.7–4.0)
MCH: 30.7 pg (ref 26.0–34.0)
MCHC: 34.9 g/dL (ref 30.0–36.0)
MCV: 88.1 fL (ref 80.0–100.0)
Monocytes Absolute: 0.7 10*3/uL (ref 0.1–1.0)
Monocytes Relative: 6 %
Neutro Abs: 9.6 10*3/uL — ABNORMAL HIGH (ref 1.7–7.7)
Neutrophils Relative %: 77 %
Platelets: 254 10*3/uL (ref 150–400)
RBC: 3.45 MIL/uL — ABNORMAL LOW (ref 3.87–5.11)
RDW: 15.4 % (ref 11.5–15.5)
WBC: 12.3 10*3/uL — ABNORMAL HIGH (ref 4.0–10.5)
nRBC: 0 % (ref 0.0–0.2)

## 2022-04-28 MED ORDER — LABETALOL HCL 200 MG PO TABS
200.0000 mg | ORAL_TABLET | Freq: Once | ORAL | Status: AC
  Administered 2022-04-28: 200 mg via ORAL
  Filled 2022-04-28: qty 1

## 2022-04-28 MED ORDER — ONDANSETRON HCL 4 MG/2ML IJ SOLN
4.0000 mg | Freq: Once | INTRAMUSCULAR | Status: AC
  Administered 2022-04-28: 4 mg via INTRAVENOUS
  Filled 2022-04-28: qty 2

## 2022-04-28 MED ORDER — SODIUM CHLORIDE 0.9 % IV BOLUS
1000.0000 mL | Freq: Once | INTRAVENOUS | Status: AC
Start: 2022-04-28 — End: 2022-04-28
  Administered 2022-04-28: 1000 mL via INTRAVENOUS

## 2022-04-28 MED ORDER — ACETAMINOPHEN 500 MG PO TABS
1000.0000 mg | ORAL_TABLET | Freq: Once | ORAL | Status: AC
  Administered 2022-04-28: 1000 mg via ORAL
  Filled 2022-04-28: qty 2
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
## 2022-04-28 NOTE — Discharge Instructions (Addendum)
Take Tylenol every 6-8 hours as needed for pain. ?Please continue your Labetalol as prescribed and discuss with your primary care doctor further blood pressure control given that your blood pressures remain elevated. ?

## 2022-05-18 ENCOUNTER — Ambulatory Visit: Payer: Medicaid Other

## 2022-06-08 ENCOUNTER — Ambulatory Visit: Payer: Medicaid Other

## 2022-06-29 ENCOUNTER — Ambulatory Visit: Payer: Medicaid Other
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
